# Patient Record
Sex: Male | Born: 1975 | Race: White | Hispanic: No | Marital: Married | Smoking: Never smoker
Health system: Southern US, Community
[De-identification: ages and names within clinical notes are randomized; demographics above are authoritative.]

## PROBLEM LIST (undated history)

## (undated) DIAGNOSIS — E669 Obesity, unspecified: Secondary | ICD-10-CM

## (undated) DIAGNOSIS — I10 Essential (primary) hypertension: Secondary | ICD-10-CM

## (undated) HISTORY — PX: CHOLECYSTECTOMY: SHX55

## (undated) HISTORY — PX: SHOULDER SURGERY: SHX246

---

## 2004-06-26 ENCOUNTER — Emergency Department (HOSPITAL_COMMUNITY): Admission: EM | Admit: 2004-06-26 | Discharge: 2004-06-26 | Payer: Self-pay | Admitting: Family Medicine

## 2004-06-29 ENCOUNTER — Emergency Department (HOSPITAL_COMMUNITY): Admission: EM | Admit: 2004-06-29 | Discharge: 2004-06-29 | Payer: Self-pay | Admitting: Family Medicine

## 2006-01-25 ENCOUNTER — Emergency Department (HOSPITAL_COMMUNITY): Admission: EM | Admit: 2006-01-25 | Discharge: 2006-01-25 | Payer: Self-pay | Admitting: Family Medicine

## 2007-07-25 ENCOUNTER — Emergency Department (HOSPITAL_COMMUNITY): Admission: EM | Admit: 2007-07-25 | Discharge: 2007-07-25 | Payer: Self-pay | Admitting: Family Medicine

## 2008-06-30 ENCOUNTER — Encounter: Admission: RE | Admit: 2008-06-30 | Discharge: 2008-06-30 | Payer: Self-pay | Admitting: Family Medicine

## 2009-03-23 ENCOUNTER — Encounter: Payer: Self-pay | Admitting: Family Medicine

## 2009-04-13 ENCOUNTER — Ambulatory Visit: Payer: Self-pay | Admitting: Family Medicine

## 2009-04-13 DIAGNOSIS — M19019 Primary osteoarthritis, unspecified shoulder: Secondary | ICD-10-CM | POA: Insufficient documentation

## 2009-04-13 DIAGNOSIS — M5030 Other cervical disc degeneration, unspecified cervical region: Secondary | ICD-10-CM | POA: Insufficient documentation

## 2009-04-18 ENCOUNTER — Telehealth: Payer: Self-pay | Admitting: Family Medicine

## 2009-04-25 ENCOUNTER — Encounter: Payer: Self-pay | Admitting: Family Medicine

## 2009-05-11 ENCOUNTER — Telehealth: Payer: Self-pay | Admitting: Family Medicine

## 2009-05-22 ENCOUNTER — Encounter: Payer: Self-pay | Admitting: Family Medicine

## 2009-05-22 ENCOUNTER — Telehealth: Payer: Self-pay | Admitting: Family Medicine

## 2009-06-14 ENCOUNTER — Encounter: Admission: RE | Admit: 2009-06-14 | Discharge: 2009-06-14 | Payer: Self-pay | Admitting: Neurosurgery

## 2009-06-21 ENCOUNTER — Encounter: Payer: Self-pay | Admitting: Family Medicine

## 2009-06-26 ENCOUNTER — Encounter: Admission: RE | Admit: 2009-06-26 | Discharge: 2009-06-26 | Payer: Self-pay | Admitting: Neurosurgery

## 2009-06-27 ENCOUNTER — Telehealth: Payer: Self-pay | Admitting: Family Medicine

## 2009-07-03 ENCOUNTER — Encounter: Payer: Self-pay | Admitting: Family Medicine

## 2009-07-20 ENCOUNTER — Encounter
Admission: RE | Admit: 2009-07-20 | Discharge: 2009-07-20 | Payer: Self-pay | Admitting: Physical Medicine & Rehabilitation

## 2009-12-07 ENCOUNTER — Encounter
Admission: RE | Admit: 2009-12-07 | Discharge: 2009-12-07 | Payer: Self-pay | Admitting: Physical Medicine & Rehabilitation

## 2010-07-22 ENCOUNTER — Encounter: Payer: Self-pay | Admitting: Family Medicine

## 2010-07-31 NOTE — Consult Note (Signed)
Summary: [facility]  [facility]   Imported By: Maryln Gottron 07/07/2009 15:03:16  _____________________________________________________________________  External Attachment:    Type:   Image     Comment:   External Document

## 2010-07-31 NOTE — Letter (Signed)
Summary: [facility]  [facility]   Imported By: Maryln Gottron 07/20/2009 11:17:27  _____________________________________________________________________  External Attachment:    Type:   Image     Comment:   External Document

## 2010-07-31 NOTE — Letter (Signed)
Summary: [facility]  [facility]   Imported By: Maryln Gottron 07/20/2009 11:18:47  _____________________________________________________________________  External Attachment:    Type:   Image     Comment:   External Document

## 2010-07-31 NOTE — Consult Note (Signed)
Summary: [facility]  [facility]   Imported By: Maryln Gottron 09/06/2009 13:35:32  _____________________________________________________________________  External Attachment:    Type:   Image     Comment:   External Document

## 2010-11-05 ENCOUNTER — Ambulatory Visit (INDEPENDENT_AMBULATORY_CARE_PROVIDER_SITE_OTHER): Payer: BC Managed Care – PPO

## 2010-11-05 ENCOUNTER — Inpatient Hospital Stay (INDEPENDENT_AMBULATORY_CARE_PROVIDER_SITE_OTHER)
Admission: RE | Admit: 2010-11-05 | Discharge: 2010-11-05 | Disposition: A | Discharge status: Home / Self Care | Payer: BC Managed Care – PPO | Source: Ambulatory Visit | Attending: Family Medicine | Admitting: Family Medicine

## 2010-11-05 DIAGNOSIS — S6390XA Sprain of unspecified part of unspecified wrist and hand, initial encounter: Secondary | ICD-10-CM

## 2011-03-22 ENCOUNTER — Emergency Department (HOSPITAL_COMMUNITY): Payer: No Typology Code available for payment source

## 2011-03-22 ENCOUNTER — Emergency Department (HOSPITAL_COMMUNITY)
Admission: EM | Admit: 2011-03-22 | Discharge: 2011-03-23 | Disposition: A | Discharge status: Home / Self Care | Payer: No Typology Code available for payment source | Attending: Emergency Medicine | Admitting: Emergency Medicine

## 2011-03-22 DIAGNOSIS — R071 Chest pain on breathing: Secondary | ICD-10-CM | POA: Insufficient documentation

## 2011-03-22 DIAGNOSIS — R0602 Shortness of breath: Secondary | ICD-10-CM | POA: Insufficient documentation

## 2011-03-22 LAB — DIFFERENTIAL
Basophils Absolute: 0.1 10*3/uL (ref 0.0–0.1)
Basophils Relative: 1 % (ref 0–1)
Eosinophils Absolute: 0.1 10*3/uL (ref 0.0–0.7)
Eosinophils Relative: 2 % (ref 0–5)
Lymphocytes Relative: 37 % (ref 12–46)
Monocytes Absolute: 0.7 10*3/uL (ref 0.1–1.0)
Monocytes Relative: 9 % (ref 3–12)
Neutrophils Relative %: 52 % (ref 43–77)

## 2011-03-22 LAB — COMPREHENSIVE METABOLIC PANEL
AST: 38 U/L — ABNORMAL HIGH (ref 0–37)
Albumin: 4.1 g/dL (ref 3.5–5.2)
Alkaline Phosphatase: 66 U/L (ref 39–117)
BUN: 13 mg/dL (ref 6–23)
CO2: 28 mEq/L (ref 19–32)
Chloride: 107 mEq/L (ref 96–112)
Creatinine, Ser: 1.22 mg/dL (ref 0.50–1.35)
Potassium: 3.7 mEq/L (ref 3.5–5.1)
Sodium: 144 mEq/L (ref 135–145)
Total Bilirubin: 1.3 mg/dL — ABNORMAL HIGH (ref 0.3–1.2)
Total Protein: 6.8 g/dL (ref 6.0–8.3)

## 2011-03-22 LAB — URINE MICROSCOPIC-ADD ON

## 2011-03-22 LAB — URINALYSIS, ROUTINE W REFLEX MICROSCOPIC
Bilirubin Urine: NEGATIVE
Glucose, UA: NEGATIVE mg/dL
Ketones, ur: NEGATIVE mg/dL
Leukocytes, UA: NEGATIVE
Nitrite: NEGATIVE
Protein, ur: NEGATIVE mg/dL
Specific Gravity, Urine: 1.02 (ref 1.005–1.030)
Urobilinogen, UA: 0.2 mg/dL (ref 0.0–1.0)

## 2011-03-22 LAB — CBC
MCH: 32.9 pg (ref 26.0–34.0)
MCHC: 37.5 g/dL — ABNORMAL HIGH (ref 30.0–36.0)
Platelets: 217 10*3/uL (ref 150–400)
RBC: 4.32 MIL/uL (ref 4.22–5.81)
WBC: 7.5 10*3/uL (ref 4.0–10.5)

## 2011-03-22 LAB — POCT I-STAT TROPONIN I: Troponin i, poc: 0.04 ng/mL (ref 0.00–0.08)

## 2011-03-22 LAB — D-DIMER, QUANTITATIVE: D-Dimer, Quant: <0.22 ug/mL-FEU (ref 0.00–0.48)

## 2011-03-23 LAB — POCT I-STAT TROPONIN I: Troponin i, poc: 0 ng/mL (ref 0.00–0.08)

## 2014-12-02 ENCOUNTER — Emergency Department (INDEPENDENT_AMBULATORY_CARE_PROVIDER_SITE_OTHER)
Admission: EM | Admit: 2014-12-02 | Discharge: 2014-12-02 | Disposition: A | Discharge status: Home / Self Care | Payer: No Typology Code available for payment source | Source: Home / Self Care | Attending: Family Medicine | Admitting: Family Medicine

## 2014-12-02 ENCOUNTER — Encounter (HOSPITAL_COMMUNITY): Payer: Self-pay | Admitting: Emergency Medicine

## 2014-12-02 DIAGNOSIS — S39012A Strain of muscle, fascia and tendon of lower back, initial encounter: Secondary | ICD-10-CM | POA: Diagnosis not present

## 2014-12-02 MED ORDER — METAXALONE 800 MG PO TABS: 800.0000 mg | ORAL_TABLET | Freq: Three times a day (TID) | ORAL | Status: DC

## 2014-12-02 NOTE — Discharge Instructions (Signed)
Heat, stretch and medicine as prescribed, return as needed.

## 2014-12-02 NOTE — ED Notes (Signed)
Pt was lifting something yesterday when he felt a pull in his right mid back.  He now has sharp pain when taking deep breaths and has some limited ROM in the back.

## 2014-12-02 NOTE — ED Provider Notes (Addendum)
CSN: [id number]     Arrival date & time 12/02/14  1620 History   First MD Initiated Contact with Patient 12/02/14 1748     Chief Complaint  Patient presents with  . Back Pain   (Consider location/radiation/quality/duration/timing/severity/associated sxs/prior Treatment) Patient is a 39 y.o. male presenting with back pain. The history is provided by the patient.  Back Pain Location:  Lumbar spine Quality:  Stabbing and shooting Radiates to:  Does not radiate Pain severity:  Mild Onset quality:  Sudden Duration:  1 day Chronicity:  New Context: lifting heavy objects   Context comment:  Lifting a heavy trash bag for parents and felt pain in right mid back which continues with certain miovements and positions   History reviewed. No pertinent past medical history. Past Surgical History  Procedure Laterality Date  . Cholecystectomy    . Shoulder surgery     History reviewed. No pertinent family history. History  Substance Use Topics  . Smoking status: Never Smoker   . Smokeless tobacco: Never Used  . Alcohol Use: No    Review of Systems  Constitutional: Negative.   Gastrointestinal: Negative.   Genitourinary: Negative.   Musculoskeletal: Positive for back pain. Negative for joint swelling and gait problem.  Skin: Negative.     Allergies  Niacin  Home Medications   Prior to Admission medications   Medication Sig Start Date End Date Taking? Authorizing Provider  metaxalone (SKELAXIN) 800 MG tablet Take 1 tablet (800 mg total) by mouth 3 (three) times daily. 12/02/14   Linna HoffJames D Kindl, MD   BP 121/80 mmHg  Pulse 88  Temp(Src) 97.9 F (36.6 C) (Oral)  Resp 16  SpO2 100% Physical Exam  Constitutional: He is oriented to person, place, and time. He appears well-developed and well-nourished.  Cardiovascular: Normal heart sounds.   Pulmonary/Chest: Effort normal and breath sounds normal. No respiratory distress.  Abdominal: Soft. Bowel sounds are normal.  Musculoskeletal:  He exhibits tenderness.       Back:  Neurological: He is alert and oriented to person, place, and time.  Skin: Skin is warm and dry.  Nursing note and vitals reviewed.   ED Course  Procedures (including critical care time) Labs Review Labs Reviewed - No data to display  Imaging Review No results found.   MDM   1. Low back strain, initial encounter        Linna HoffJames D Kindl, MD 12/02/14 1809  Linna HoffJames D Kindl, MD 12/02/14 2154

## 2018-02-11 ENCOUNTER — Other Ambulatory Visit: Payer: Self-pay | Admitting: Orthopaedic Surgery

## 2018-02-11 DIAGNOSIS — M546 Pain in thoracic spine: Secondary | ICD-10-CM

## 2018-02-16 ENCOUNTER — Ambulatory Visit
Admission: RE | Admit: 2018-02-16 | Discharge: 2018-02-16 | Disposition: A | Discharge status: Home / Self Care | Payer: BC Managed Care – PPO | Source: Ambulatory Visit | Attending: Orthopaedic Surgery | Admitting: Orthopaedic Surgery

## 2018-02-16 DIAGNOSIS — M546 Pain in thoracic spine: Secondary | ICD-10-CM

## 2019-05-03 IMAGING — MR MR THORACIC SPINE W/O CM
4 of 6 series · 14 of 48 positions shown · non-contrast
Comparison: MRIs dated 08/04/2014 and 06/26/2009

CLINICAL DATA: Chronic recurrent thoracic back pain.

EXAM:
MRI THORACIC SPINE WITHOUT CONTRAST
TECHNIQUE: Multiplanar, multisequence MR imaging of the thoracic spine was
performed. No intravenous contrast was administered.

[Series 4: T2 · sagittal · 4.0mm · 0.43mm/px · 5 of 17 slices shown (1 of 3)]
[im 1/17]
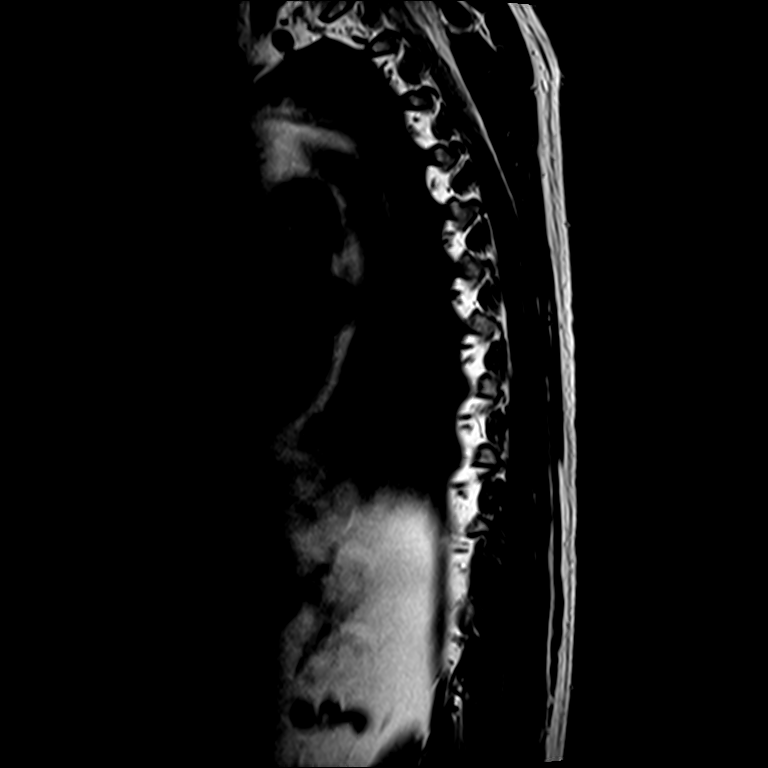
[im 4/17]
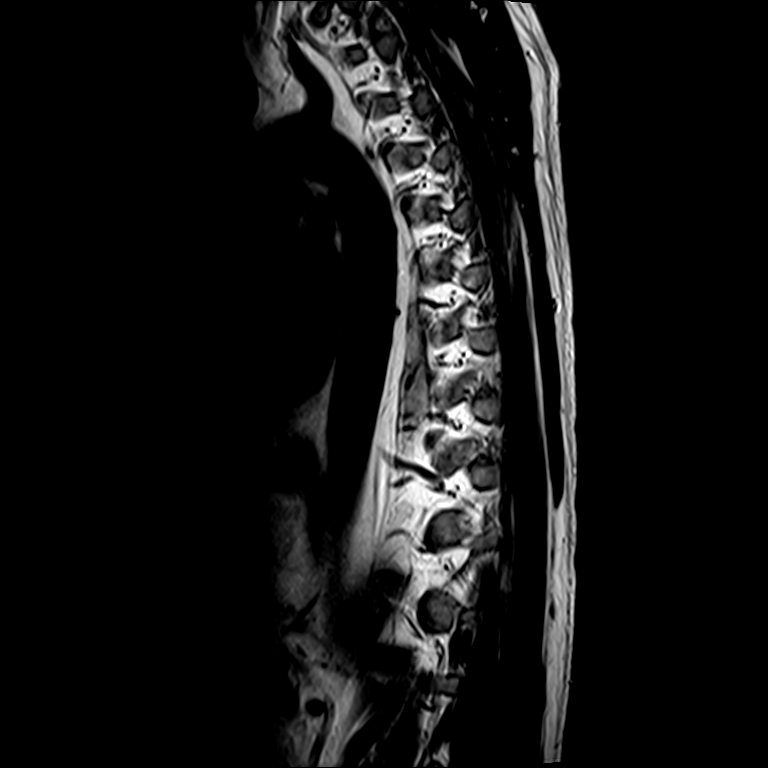
[im 7/17]
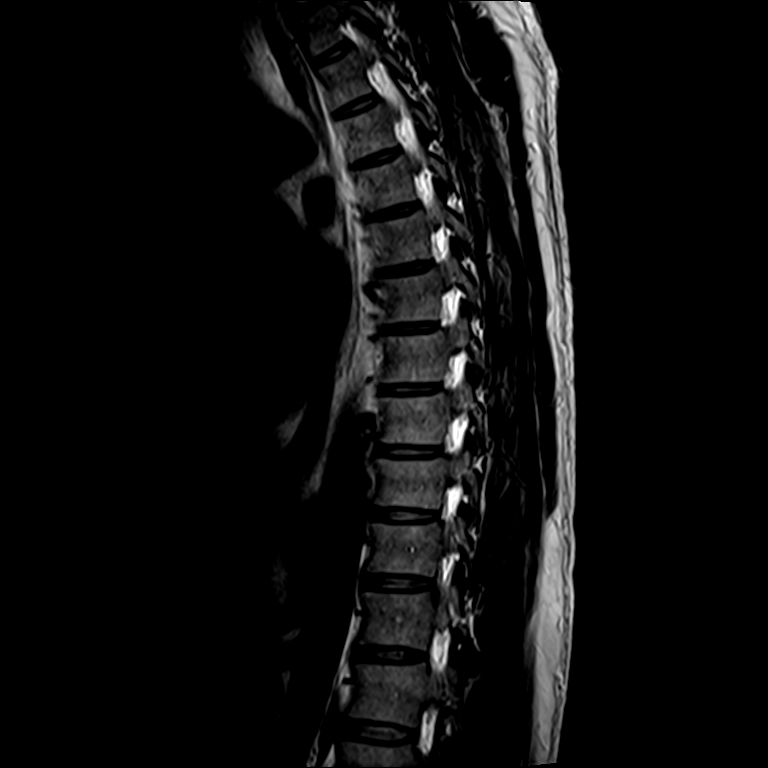
[im 10/17]
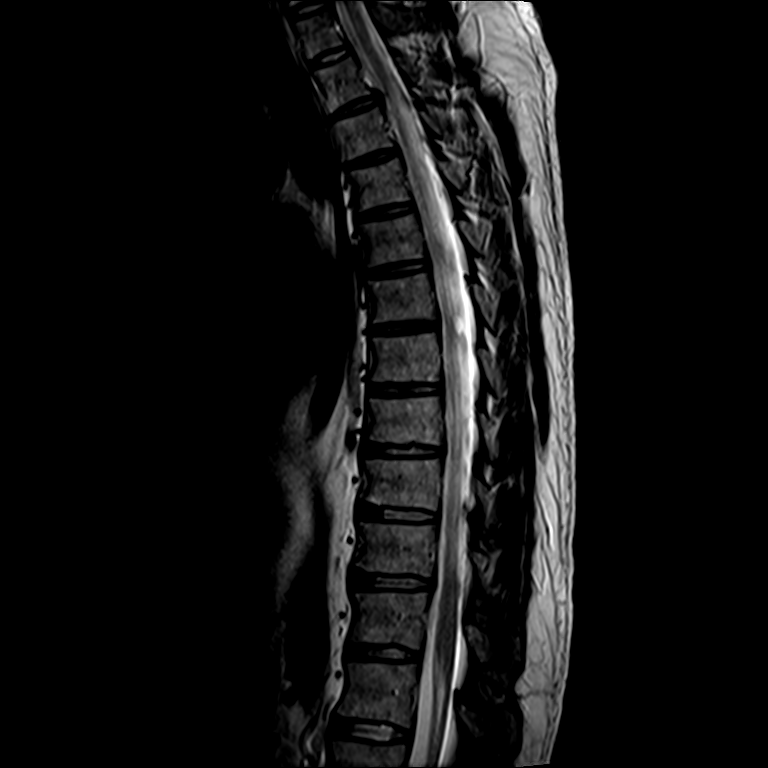
[im 17/17]
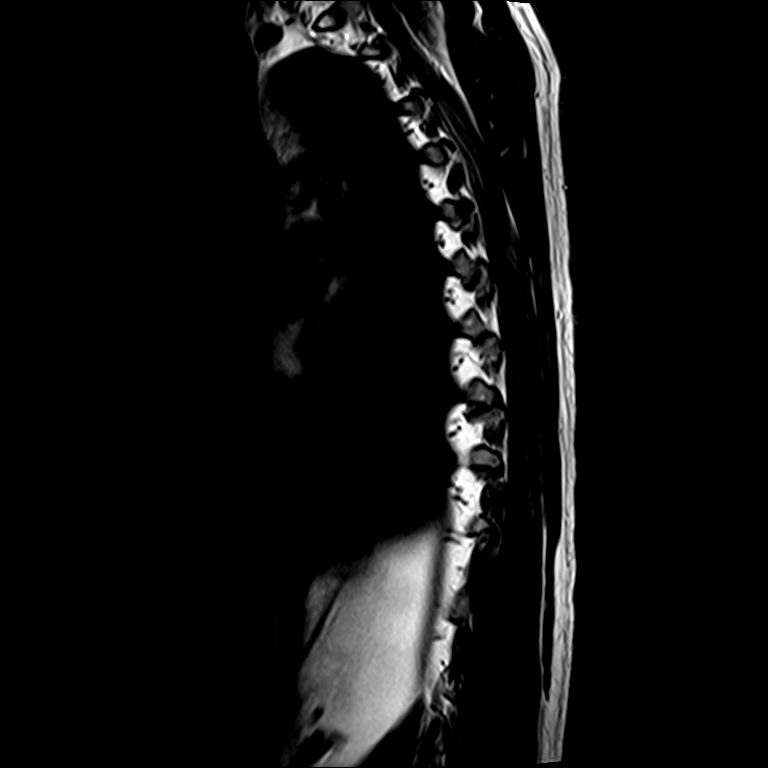

[Series 6: T1 · sagittal · 4.0mm · 0.86mm/px · 3 of 17 slices shown]
[im 4/17]
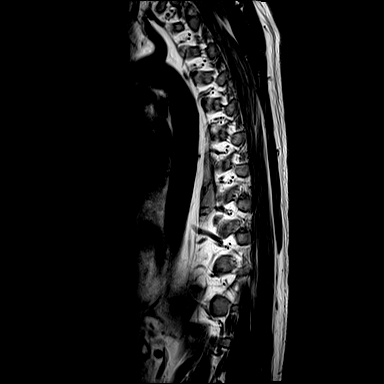
[im 10/17]
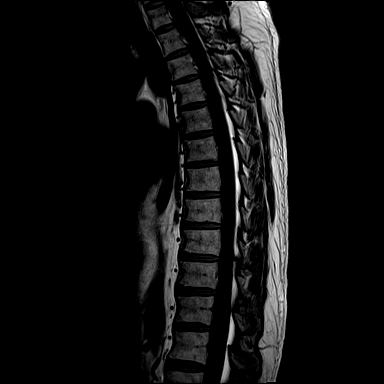
[im 17/17]
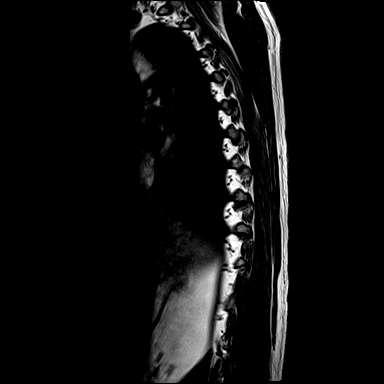

[Series 7: T2 · axial · 4.0mm · 0.39mm/px · z∈[-301,-120]mm · 3 of 36 slices shown (2 of 3)]
[im 6/36]
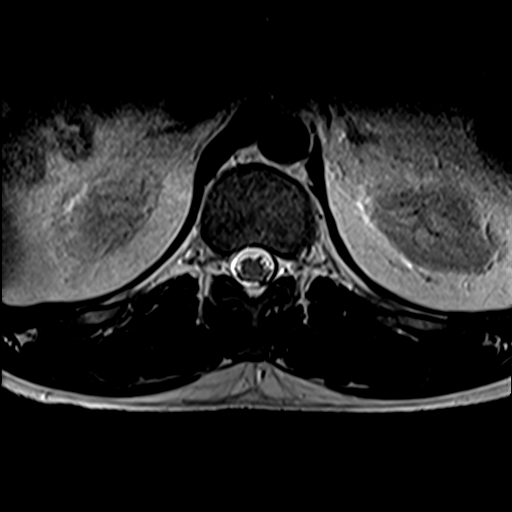
[im 18/36]
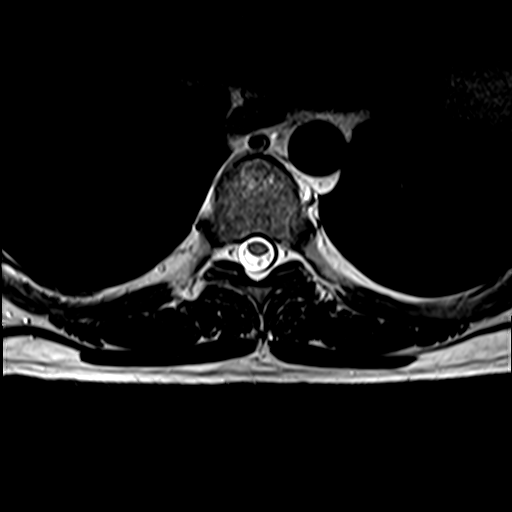
[im 30/36]
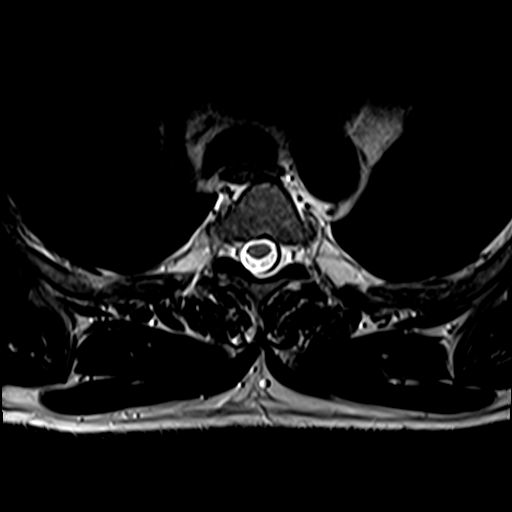

[Series 8: T2 · axial · 4.0mm · 0.39mm/px · z∈[-301,-120]mm · 3 of 36 slices shown (3 of 3)]
[im 6/36]
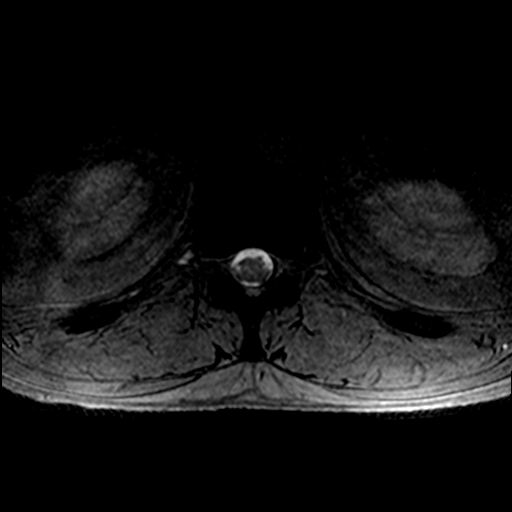
[im 18/36]
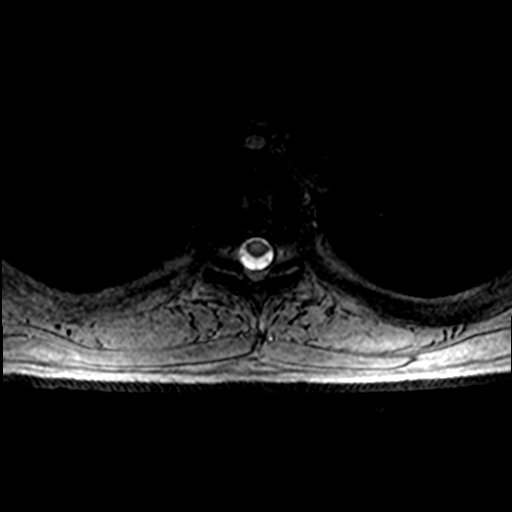
[im 30/36]
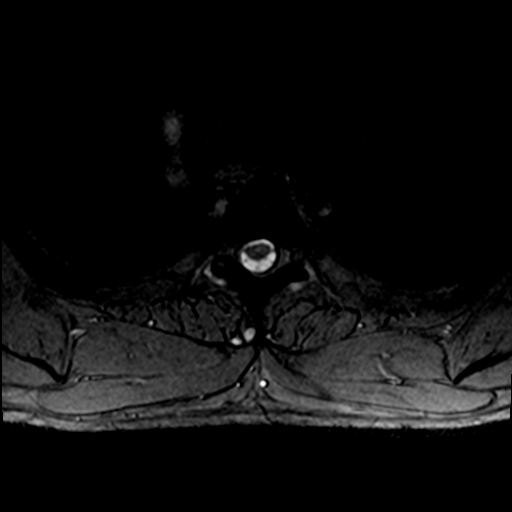

[14 of 48 positions shown; findings below may reference images not displayed]

FINDINGS: Alignment:  Physiologic.

Vertebrae: No fracture, evidence of discitis, or bone lesion.

Cord:  Normal signal and morphology.  Tip of the conus is at T12.

Paraspinal and other soft tissues: Negative.

Disc levels:

T1-2: Normal.

T2-3: Chronic small soft disc protrusion just to the right of
midline extending into the inferior aspect of the right neural
foramen without neural impingement. The appearance is essentially
unchanged. Slight mass effect upon the ventral aspect of the right
side of the spinal cord without myelopathy.

T3-4 through T12-L1: No significant abnormality of the discs. Tiny
disc protrusions seen at T6-7 and T8-9 on the prior exam are not
visible on this exam.

No spinal or foraminal stenosis.  No appreciable facet arthritis.
IMPRESSION: 1. Chronic small disc protrusion to the right of midline at T2-3
with a slight impression upon the ventral aspect of the right side
of the spinal cord, unchanged.
2. No other significant abnormalities.

## 2019-07-31 ENCOUNTER — Encounter (HOSPITAL_COMMUNITY): Payer: Self-pay | Admitting: Emergency Medicine

## 2019-07-31 ENCOUNTER — Other Ambulatory Visit: Payer: Self-pay

## 2019-07-31 ENCOUNTER — Emergency Department (HOSPITAL_COMMUNITY): Payer: BC Managed Care – PPO

## 2019-07-31 ENCOUNTER — Emergency Department (HOSPITAL_COMMUNITY)
Admission: EM | Admit: 2019-07-31 | Discharge: 2019-07-31 | Disposition: A | Discharge status: Home / Self Care | Payer: BC Managed Care – PPO | Attending: Emergency Medicine | Admitting: Emergency Medicine

## 2019-07-31 ENCOUNTER — Telehealth: Payer: Self-pay | Admitting: Physician Assistant

## 2019-07-31 DIAGNOSIS — M545 Low back pain: Secondary | ICD-10-CM | POA: Insufficient documentation

## 2019-07-31 DIAGNOSIS — R42 Dizziness and giddiness: Secondary | ICD-10-CM | POA: Insufficient documentation

## 2019-07-31 DIAGNOSIS — J1282 Pneumonia due to coronavirus disease 2019: Secondary | ICD-10-CM | POA: Insufficient documentation

## 2019-07-31 DIAGNOSIS — R05 Cough: Secondary | ICD-10-CM | POA: Diagnosis present

## 2019-07-31 DIAGNOSIS — U071 COVID-19: Secondary | ICD-10-CM | POA: Diagnosis not present

## 2019-07-31 DIAGNOSIS — R0600 Dyspnea, unspecified: Secondary | ICD-10-CM | POA: Insufficient documentation

## 2019-07-31 HISTORY — DX: Obesity, unspecified: E66.9

## 2019-07-31 HISTORY — DX: Essential (primary) hypertension: I10

## 2019-07-31 LAB — POC SARS CORONAVIRUS 2 AG -  ED: SARS Coronavirus 2 Ag: POSITIVE — AB

## 2019-07-31 MED ORDER — HYDROCOD POLST-CPM POLST ER 10-8 MG/5ML PO SUER
5.0000 mL | Freq: Once | ORAL | Status: AC
  Administered 2019-07-31: 5 mL via ORAL
  Filled 2019-07-31: qty 5

## 2019-07-31 MED ORDER — HYDROCOD POLST-CPM POLST ER 10-8 MG/5ML PO SUER: 5.0000 mL | Freq: Two times a day (BID) | ORAL | 0 refills | Status: AC | PRN

## 2019-07-31 NOTE — ED Provider Notes (Signed)
[facility] EMERGENCY DEPARTMENT Provider Note   CSN: [id number] Arrival date & time: 07/31/19  1020     History Chief Complaint  Patient presents with  . Shortness of Breath  . Cough  . Fever    Johnny Knight is a 44 y.o. male.  HPI 44 year old male presents today complaining of cough.  He states that he was exposed to Covid 13 days ago.  He began having symptoms on Wednesday.  He states he was at a church event that 24 people got sick after.  His wife and child have been sick prior to him beginning having symptoms.  He states he took his daughter to school in [place] on Wednesday.  When he returned from the drive he felt weak lightheaded and started coughing.  He has continued to cough.  He had some dyspnea last night and checked his oxygen saturations and they were 88%.  He states they were 92% on arrival here.  He denies denies cough being productive.  He has been taking ibuprofen for some back pain.  Pain is in the low back area.  He denies any definite fever.  He has had some chills and diffuse muscle aches.    History reviewed. No pertinent past medical history.  Patient Active Problem List   Diagnosis Date Noted  . ARTHRITIS, SHOULDER 04/13/2009  . 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 CHOLECYSTECTOMY    . SHOULDER SURGERY         No family history on file.  Social History   Tobacco Use  . Smoking status: Never Smoker  . Smokeless tobacco: Never Used  Substance Use Topics  . Alcohol use: No  . Drug use: No    Home Medications Prior to Admission medications   Medication Sig Start Date End Date Taking? Authorizing Provider  metaxalone (SKELAXIN) 800 MG tablet Take 1 tablet (800 mg total) by mouth 3 (three) times daily. 12/02/14   Billy Fischer, MD    Allergies    Niacin  Review of Systems   Review of Systems  All other systems reviewed and are  negative.   Physical Exam Updated Vital Signs BP 112/76 (BP Location: Left Arm)   Pulse 98   Temp 99.5 F (37.5 C) (Oral)   Resp (!) 22   Ht 1.778 m (5\' 10" )   Wt 95.3 kg   SpO2 92%   BMI 30.13 kg/m   Physical Exam Vitals and nursing note reviewed.  Constitutional:      General: He is not in acute distress.    Appearance: He is well-developed. He is ill-appearing.  HENT:     Head: Normocephalic.     Mouth/Throat:     Mouth: Mucous membranes are moist.  Eyes:     Pupils: Pupils are equal, round, and reactive to light.  Cardiovascular:     Rate and Rhythm: Normal rate and regular rhythm.  Pulmonary:     Effort: Pulmonary effort is normal.     Breath sounds: Normal breath sounds.     Comments: Coughing on exam Abdominal:     General: Bowel sounds are normal.     Palpations: Abdomen is soft.  Musculoskeletal:        General: Normal range of motion.     Cervical back: Normal range of motion.     Right lower leg: No tenderness. No edema.     Left lower leg:  No tenderness. No edema.  Skin:    General: Skin is warm and dry.     Capillary Refill: Capillary refill takes less than 2 seconds.  Neurological:     General: No focal deficit present.     Mental Status: He is alert.  Psychiatric:        Mood and Affect: Mood normal.     ED Results / Procedures / Treatments   Labs (all labs ordered are listed, but only abnormal results are displayed) Labs Reviewed  POC SARS CORONAVIRUS 2 AG -  ED    EKG None  Radiology No results found.  Procedures Procedures (including critical care time)  Medications Ordered in ED Medications - No data to display  ED Course  I have reviewed the triage vital signs and the nursing notes.  Pertinent labs & imaging results that were available during my care of the patient were reviewed by me and considered in my medical decision making (see chart for details). Johnny Knight was evaluated in Emergency Department on 07/31/2019  for the symptoms described in the history of present illness. He was evaluated in the context of the global COVID-19 pandemic, which necessitated consideration that the patient might be at risk for infection with the SARS-CoV-2 virus that causes COVID-19. Institutional protocols and algorithms that pertain to the evaluation of patients at risk for COVID-19 are in a state of rapid change based on information released by regulatory bodies including the CDC and federal and state organizations. These policies and algorithms were followed during the patient's care in the ED.  44 year old male history of hypertension day 3 of illness Covid positive presents complaining of ongoing cough with some low sats at home.  Oxygen saturation has been 92 to 99% here in the ED.  He is not tachypneic.  Plan Tussionex.  He has been referred to the [facility].  Patient is advised of return precautions and need for follow-up and voices understanding.    MDM Rules/Calculators/A&P                     Final Clinical Impression(s) / ED Diagnoses Final diagnoses:  Pneumonia due to COVID-19 virus    Rx / DC Orders ED Discharge Orders    None       Margarita Grizzle, MD 07/31/19 1147

## 2019-07-31 NOTE — Discharge Instructions (Addendum)
You are covid positive and appear to have covid pneumonia.  Please use cough medicine as needed to control your cough.  Here you oxygen has been normal to slightly low.  If it is worsening and staying below 90%, please return to the ED.  You have been referred for monoclonal antibody therapy, please answer your phone for follow up Monday.

## 2019-07-31 NOTE — ED Notes (Signed)
Pt discharge instructions and follow-up care reviewed with the patient. Pt verbalized understanding of instructions. Pt discharged.

## 2019-07-31 NOTE — ED Triage Notes (Signed)
Pt. Stated, My daughter and wife tested positive for COVID and my Dr. Lanna Poche, never mind testing you have it. I started having cough, SOB not moving anything in my chest. Ive had sweats, fever since Wednesday. My SOB seems worse.

## 2019-07-31 NOTE — Telephone Encounter (Signed)
Called to discuss with patient about Covid symptoms and the use of bamlanivimab or casirivimab/imdevimab, a monoclonal antibody infusion for those with mild to moderate Covid symptoms and at a high risk of hospitalization. Upon initial chart review, the pt would not qualify given age <55 with HTN.    Message left to call back to see if there are any other undocumented RFs that might qualify him.   Cline Crock PA-C  [facility]

## 2019-08-02 ENCOUNTER — Emergency Department (HOSPITAL_COMMUNITY): Payer: BC Managed Care – PPO

## 2019-08-02 ENCOUNTER — Encounter (HOSPITAL_COMMUNITY): Payer: Self-pay | Admitting: *Deleted

## 2019-08-02 ENCOUNTER — Other Ambulatory Visit: Payer: Self-pay

## 2019-08-02 ENCOUNTER — Inpatient Hospital Stay (HOSPITAL_COMMUNITY)
Admission: EM | Admit: 2019-08-02 | Discharge: 2019-08-04 | DRG: 177 | Disposition: A | Discharge status: Home / Self Care | Payer: BC Managed Care – PPO | Attending: Internal Medicine | Admitting: Internal Medicine

## 2019-08-02 DIAGNOSIS — R0902 Hypoxemia: Secondary | ICD-10-CM | POA: Diagnosis present

## 2019-08-02 DIAGNOSIS — E860 Dehydration: Secondary | ICD-10-CM | POA: Diagnosis present

## 2019-08-02 DIAGNOSIS — J9601 Acute respiratory failure with hypoxia: Secondary | ICD-10-CM | POA: Diagnosis not present

## 2019-08-02 DIAGNOSIS — J1282 Pneumonia due to coronavirus disease 2019: Secondary | ICD-10-CM | POA: Diagnosis present

## 2019-08-02 DIAGNOSIS — N179 Acute kidney failure, unspecified: Secondary | ICD-10-CM

## 2019-08-02 DIAGNOSIS — I10 Essential (primary) hypertension: Secondary | ICD-10-CM

## 2019-08-02 DIAGNOSIS — U071 COVID-19: Secondary | ICD-10-CM | POA: Diagnosis not present

## 2019-08-02 DIAGNOSIS — Z79899 Other long term (current) drug therapy: Secondary | ICD-10-CM | POA: Diagnosis not present

## 2019-08-02 LAB — CBC WITH DIFFERENTIAL/PLATELET
Abs Immature Granulocytes: 0.03 10*3/uL (ref 0.00–0.07)
Basophils Absolute: 0 10*3/uL (ref 0.0–0.1)
Basophils Relative: 0 %
Eosinophils Absolute: 0.1 10*3/uL (ref 0.0–0.5)
Eosinophils Relative: 1 %
HCT: 37.9 % — ABNORMAL LOW (ref 39.0–52.0)
Hemoglobin: 13.6 g/dL (ref 13.0–17.0)
Immature Granulocytes: 1 %
Lymphocytes Relative: 19 %
Lymphs Abs: 1 10*3/uL (ref 0.7–4.0)
MCH: 33.6 pg (ref 26.0–34.0)
MCHC: 35.9 g/dL (ref 30.0–36.0)
MCV: 93.6 fL (ref 80.0–100.0)
Monocytes Absolute: 0.4 10*3/uL (ref 0.1–1.0)
Monocytes Relative: 7 %
Neutro Abs: 3.7 10*3/uL (ref 1.7–7.7)
Neutrophils Relative %: 72 %
Platelets: 229 10*3/uL (ref 150–400)
RBC: 4.05 MIL/uL — ABNORMAL LOW (ref 4.22–5.81)
RDW: 11.2 % — ABNORMAL LOW (ref 11.5–15.5)
WBC: 5.2 10*3/uL (ref 4.0–10.5)
nRBC: 0 % (ref 0.0–0.2)

## 2019-08-02 LAB — LACTATE DEHYDROGENASE: LDH: 366 U/L — ABNORMAL HIGH (ref 98–192)

## 2019-08-02 LAB — COMPREHENSIVE METABOLIC PANEL
ALT: 72 U/L — ABNORMAL HIGH (ref 0–44)
AST: 82 U/L — ABNORMAL HIGH (ref 15–41)
Albumin: 3.1 g/dL — ABNORMAL LOW (ref 3.5–5.0)
Alkaline Phosphatase: 100 U/L (ref 38–126)
Anion gap: 16 — ABNORMAL HIGH (ref 5–15)
BUN: 12 mg/dL (ref 6–20)
CO2: 20 mmol/L — ABNORMAL LOW (ref 22–32)
Calcium: 8.3 mg/dL — ABNORMAL LOW (ref 8.9–10.3)
Chloride: 105 mmol/L (ref 98–111)
Creatinine, Ser: 1.49 mg/dL — ABNORMAL HIGH (ref 0.61–1.24)
GFR calc Af Amer: >60 mL/min (ref >60)
GFR calc non Af Amer: 57 mL/min — ABNORMAL LOW (ref >60)
Glucose, Bld: 97 mg/dL (ref 70–99)
Potassium: 3.4 mmol/L — ABNORMAL LOW (ref 3.5–5.1)
Sodium: 141 mmol/L (ref 135–145)
Total Bilirubin: 1.8 mg/dL — ABNORMAL HIGH (ref 0.3–1.2)
Total Protein: 6.4 g/dL — ABNORMAL LOW (ref 6.5–8.1)

## 2019-08-02 LAB — TRIGLYCERIDES: Triglycerides: 123 mg/dL (ref <150)

## 2019-08-02 LAB — FERRITIN: Ferritin: 486 ng/mL — ABNORMAL HIGH (ref 24–336)

## 2019-08-02 LAB — PROCALCITONIN: Procalcitonin: <0.1 ng/mL

## 2019-08-02 LAB — HIV ANTIBODY (ROUTINE TESTING W REFLEX): HIV Screen 4th Generation wRfx: NONREACTIVE

## 2019-08-02 LAB — LACTIC ACID, PLASMA
Lactic Acid, Venous: 1.7 mmol/L (ref 0.5–1.9)
Lactic Acid, Venous: 0.9 mmol/L (ref 0.5–1.9)

## 2019-08-02 LAB — D-DIMER, QUANTITATIVE: D-Dimer, Quant: 1.34 ug/mL-FEU — ABNORMAL HIGH (ref 0.00–0.50)

## 2019-08-02 LAB — C-REACTIVE PROTEIN: CRP: 11.1 mg/dL — ABNORMAL HIGH (ref <1.0)

## 2019-08-02 LAB — CK: Total CK: 90 U/L (ref 49–397)

## 2019-08-02 LAB — FIBRINOGEN: Fibrinogen: 732 mg/dL — ABNORMAL HIGH (ref 210–475)

## 2019-08-02 MED ORDER — HYDROCOD POLST-CPM POLST ER 10-8 MG/5ML PO SUER: 5.0000 mL | Freq: Two times a day (BID) | ORAL | Status: DC

## 2019-08-02 MED ORDER — INFLUENZA VAC SPLIT QUAD 0.5 ML IM SUSY
0.5000 mL | PREFILLED_SYRINGE | INTRAMUSCULAR | Status: DC | PRN
  Filled 2019-08-02: qty 0.5

## 2019-08-02 MED ORDER — ALBUTEROL SULFATE HFA 108 (90 BASE) MCG/ACT IN AERS
2.0000 | INHALATION_SPRAY | Freq: Four times a day (QID) | RESPIRATORY_TRACT | Status: DC
  Administered 2019-08-02: 16:00:00 2 via RESPIRATORY_TRACT
  Filled 2019-08-02: qty 6.7

## 2019-08-02 MED ORDER — POTASSIUM CHLORIDE CRYS ER 20 MEQ PO TBCR: 20.0000 meq | EXTENDED_RELEASE_TABLET | Freq: Once | ORAL | Status: DC

## 2019-08-02 MED ORDER — IPRATROPIUM-ALBUTEROL 20-100 MCG/ACT IN AERS
1.0000 | INHALATION_SPRAY | Freq: Four times a day (QID) | RESPIRATORY_TRACT | Status: DC | PRN
  Filled 2019-08-02: qty 4

## 2019-08-02 MED ORDER — GUAIFENESIN-DM 100-10 MG/5ML PO SYRP
10.0000 mL | ORAL_SOLUTION | ORAL | Status: DC | PRN
  Administered 2019-08-02 – 2019-08-04 (×5): 10 mL via ORAL
  Filled 2019-08-02 (×5): qty 10

## 2019-08-02 MED ORDER — ATENOLOL 25 MG PO TABS: 25.0000 mg | ORAL_TABLET | Freq: Every day | ORAL | Status: DC

## 2019-08-02 MED ORDER — BENZONATATE 100 MG PO CAPS
200.0000 mg | ORAL_CAPSULE | Freq: Three times a day (TID) | ORAL | Status: DC
  Filled 2019-08-02: qty 2

## 2019-08-02 MED ORDER — SODIUM CHLORIDE 0.9 % IV SOLN
200.0000 mg | Freq: Once | INTRAVENOUS | Status: AC
  Administered 2019-08-02: 12:00:00 200 mg via INTRAVENOUS
  Filled 2019-08-02: qty 200

## 2019-08-02 MED ORDER — HYDROCOD POLST-CPM POLST ER 10-8 MG/5ML PO SUER
5.0000 mL | Freq: Two times a day (BID) | ORAL | Status: DC
  Administered 2019-08-02: 5 mL via ORAL
  Filled 2019-08-02: qty 5

## 2019-08-02 MED ORDER — SODIUM CHLORIDE 0.9 % IV SOLN
100.0000 mg | INTRAVENOUS | Status: DC
  Administered 2019-08-03 – 2019-08-04 (×2): 100 mg via INTRAVENOUS
  Filled 2019-08-02 (×2): qty 20

## 2019-08-02 MED ORDER — ACETAMINOPHEN 500 MG PO TABS
1000.0000 mg | ORAL_TABLET | Freq: Once | ORAL | Status: AC
  Administered 2019-08-02: 11:00:00 1000 mg via ORAL
  Filled 2019-08-02: qty 2

## 2019-08-02 MED ORDER — ACETAMINOPHEN 325 MG PO TABS: 650.0000 mg | ORAL_TABLET | Freq: Four times a day (QID) | ORAL | Status: DC | PRN

## 2019-08-02 MED ORDER — AMLODIPINE BESYLATE 5 MG PO TABS: 5.0000 mg | ORAL_TABLET | Freq: Every day | ORAL | Status: DC

## 2019-08-02 MED ORDER — DEXAMETHASONE SODIUM PHOSPHATE 10 MG/ML IJ SOLN
10.0000 mg | Freq: Once | INTRAMUSCULAR | Status: AC
  Administered 2019-08-02: 10 mg via INTRAVENOUS
  Filled 2019-08-02: qty 1

## 2019-08-02 MED ORDER — DEXAMETHASONE SODIUM PHOSPHATE 10 MG/ML IJ SOLN
6.0000 mg | INTRAMUSCULAR | Status: DC
  Administered 2019-08-03 – 2019-08-04 (×2): 6 mg via INTRAVENOUS
  Filled 2019-08-02 (×2): qty 1

## 2019-08-02 MED ORDER — HYDROCOD POLST-CPM POLST ER 10-8 MG/5ML PO SUER: 5.0000 mL | Freq: Two times a day (BID) | ORAL | Status: DC | PRN

## 2019-08-02 MED ORDER — HYDROCODONE-HOMATROPINE 5-1.5 MG/5ML PO SYRP
5.0000 mL | ORAL_SOLUTION | Freq: Four times a day (QID) | ORAL | Status: DC | PRN
  Administered 2019-08-02 – 2019-08-03 (×2): 5 mL via ORAL
  Filled 2019-08-02 (×2): qty 5

## 2019-08-02 MED ORDER — POTASSIUM CHLORIDE 20 MEQ PO PACK
20.0000 meq | PACK | Freq: Once | ORAL | Status: DC
  Filled 2019-08-02: qty 1

## 2019-08-02 MED ORDER — DEXAMETHASONE 6 MG PO TABS: 6.0000 mg | ORAL_TABLET | ORAL | Status: DC

## 2019-08-02 MED ORDER — HYDROCODONE-HOMATROPINE 5-1.5 MG/5ML PO SYRP
10.0000 mL | ORAL_SOLUTION | Freq: Four times a day (QID) | ORAL | Status: DC | PRN
  Administered 2019-08-02: 11:00:00 10 mL via ORAL
  Filled 2019-08-02: qty 10

## 2019-08-02 MED ORDER — ALBUTEROL SULFATE HFA 108 (90 BASE) MCG/ACT IN AERS
2.0000 | INHALATION_SPRAY | RESPIRATORY_TRACT | Status: DC | PRN
  Filled 2019-08-02: qty 6.7

## 2019-08-02 MED ORDER — SODIUM CHLORIDE 0.9 % IV SOLN
1000.0000 mL | INTRAVENOUS | Status: DC
  Administered 2019-08-02: 11:00:00 1000 mL via INTRAVENOUS

## 2019-08-02 MED ORDER — ALBUMIN HUMAN 25 % IV SOLN
12.5000 g | Freq: Four times a day (QID) | INTRAVENOUS | Status: DC
  Administered 2019-08-02: 16:00:00 12.5 g via INTRAVENOUS
  Filled 2019-08-02 (×3): qty 50

## 2019-08-02 MED ORDER — HYDRALAZINE HCL 25 MG PO TABS: 25.0000 mg | ORAL_TABLET | Freq: Four times a day (QID) | ORAL | Status: DC | PRN

## 2019-08-02 MED ORDER — ENOXAPARIN SODIUM 40 MG/0.4ML ~~LOC~~ SOLN
40.0000 mg | SUBCUTANEOUS | Status: DC
  Administered 2019-08-02 – 2019-08-03 (×2): 40 mg via SUBCUTANEOUS
  Filled 2019-08-02 (×2): qty 0.4

## 2019-08-02 NOTE — ED Provider Notes (Signed)
[facility] EMERGENCY DEPARTMENT Provider Note   CSN: [id number] Arrival date & time: 08/02/19  3235     History Chief Complaint  Patient presents with  . Shortness of Breath    Johnny Knight is a 44 y.o. male.  HPI Patient seen day before yesterday and diagnosed with Covid-19 pneumonia.  He had had exposure 13 days prior to presentation.  He had been at a church event whereupon 24 other people got ill.  Symptoms started 6 days ago.  Since yesterday his oxygen saturations have dropped.  He reports he measured oxygen saturations at home in the high 70s.  Cough has gotten much worse.  Patient reports he feels short of breath.  He is having hot and cold episodes.  No chest pain.  No vomiting.  No abdominal pain.  No lower extremity swelling or calf pain.  On EMS arrival, patient was 83% on room air.  They put him on a nonrebreather mask.    Past Medical History:  Diagnosis Date  . Hypertension   . Obesity (BMI 30-39.9)     Patient Active Problem List   Diagnosis Date Noted  . ARTHRITIS, SHOULDER 04/13/2009  . 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 CHOLECYSTECTOMY    . SHOULDER SURGERY         No family history on file.  Social History   Tobacco Use  . Smoking status: Never Smoker  . Smokeless tobacco: Never Used  Substance Use Topics  . Alcohol use: No  . Drug use: No    Home Medications Prior to Admission medications   Medication Sig Start Date End Date Taking? Authorizing Provider  chlorpheniramine-HYDROcodone (TUSSIONEX PENNKINETIC ER) 10-8 MG/5ML SUER Take 5 mLs by mouth every 12 (twelve) hours as needed for cough. 07/31/19   Margarita Grizzle, MD  metaxalone (SKELAXIN) 800 MG tablet Take 1 tablet (800 mg total) by mouth 3 (three) times daily. 12/02/14   Linna Hoff, MD    Allergies    Niacin  Review of Systems   Review of Systems 10 Systems reviewed and are  negative for acute change except as noted in the HPI.  Physical Exam Updated Vital Signs There were no vitals taken for this visit.  Physical Exam Constitutional:      Comments: Patient is alert and appropriate.  Mental status is clear.  He is tachypneic and having harsh cough paroxysms.  He is speaking in full sentences.  Color is good.  Mental status is alert.  HENT:     Head: Normocephalic and atraumatic.  Eyes:     Extraocular Movements: Extraocular movements intact.     Conjunctiva/sclera: Conjunctivae normal.  Cardiovascular:     Comments:  Tachycardia.  No gross rub murmur gallop.  Difficult auscultation due to coughing paroxysmal. Pulmonary:     Comments: Tachypnea.  Harsh cough.  Crackles in mid lung fields. Abdominal:     General: There is no distension.     Palpations: Abdomen is soft.     Tenderness: There is no abdominal tenderness. There is no guarding.  Musculoskeletal:        General: No swelling or tenderness. Normal range of motion.  Skin:    General: Skin is warm and dry.  Neurological:     General: No focal deficit present.     Mental Status: He is oriented to person, place, and time.     Cranial Nerves: No  cranial nerve deficit.     Coordination: Coordination normal.  Psychiatric:        Mood and Affect: Mood normal.     ED Results / Procedures / Treatments   Labs (all labs ordered are listed, but only abnormal results are displayed) Labs Reviewed  CULTURE, BLOOD (ROUTINE X 2)  CULTURE, BLOOD (ROUTINE X 2)  LACTIC ACID, PLASMA  LACTIC ACID, PLASMA  CBC WITH DIFFERENTIAL/PLATELET  COMPREHENSIVE METABOLIC PANEL  D-DIMER, QUANTITATIVE (NOT AT [facility])  PROCALCITONIN  LACTATE DEHYDROGENASE  FERRITIN  TRIGLYCERIDES  FIBRINOGEN  C-REACTIVE PROTEIN    EKG None  Radiology DG Chest Port 1 View  Result Date: 07/31/2019 CLINICAL DATA:  Nonproductive cough and shortness of breath. Hypoxia. COVID-19 virus infection. EXAM: PORTABLE CHEST 1 VIEW  COMPARISON:  03/22/2011 FINDINGS: The heart size and mediastinal contours are within normal limits. Low lung volumes are noted. Patchy areas of opacity are seen in the peripheral lung bases bilaterally, suspicious for viral pneumonia. No evidence of pleural effusion. IMPRESSION: Patchy peripheral bibasilar pulmonary opacities, suspicious for viral pneumonia. Electronically Signed   By: Danae Orleans M.D.   On: 07/31/2019 11:28    Procedures Procedures (including critical care time) CRITICAL CARE Performed by: Arby Barrette   Total critical care time: 30 minutes  Critical care time was exclusive of separately billable procedures and treating other patients.  Critical care was necessary to treat or prevent imminent or life-threatening deterioration.  Critical care was time spent personally by me on the following activities: development of treatment plan with patient and/or surrogate as well as nursing, discussions with consultants, evaluation of patient's response to treatment, examination of patient, obtaining history from patient or surrogate, ordering and performing treatments and interventions, ordering and review of laboratory studies, ordering and review of radiographic studies, pulse oximetry and re-evaluation of patient's condition. Medications Ordered in ED Medications  0.9 %  sodium chloride infusion (has no administration in time range)  dexamethasone (DECADRON) injection 10 mg (has no administration in time range)  HYDROcodone-homatropine (HYCODAN) 5-1.5 MG/5ML syrup 10 mL (has no administration in time range)  albuterol (VENTOLIN HFA) 108 (90 Base) MCG/ACT inhaler 2 puff (has no administration in time range)  acetaminophen (TYLENOL) tablet 1,000 mg (has no administration in time range)    ED Course  I have reviewed the triage vital signs and the nursing notes.  Pertinent labs & imaging results that were available during my care of the patient were reviewed by me and considered  in my medical decision making (see chart for details).    MDM Rules/Calculators/A&P                      Patient has worsening Covid pneumonia with hypoxia.  His mental status is clear.  He does have tachypnea but does not show signs of impending respiratory failure.  Patient is on a nonrebreather mask with oxygen saturations in the mid 90s.  We will proceed with treatment with remdesivir, Decadron, symptomatic management with Tylenol, albuterol, Hycodan.  plan for admission for inpatient treatment.  Johnny Knight was evaluated in Emergency Department on 08/02/2019 for the symptoms described in the history of present illness. He was evaluated in the context of the global COVID-19 pandemic, which necessitated consideration that the patient might be at risk for infection with the SARS-CoV-2 virus that causes COVID-19. Institutional protocols and algorithms that pertain to the evaluation of patients at risk for COVID-19 are in a state of rapid change based on  information released by regulatory bodies including the CDC and federal and state organizations. These policies and algorithms were followed during the patient's care in the ED. Final Clinical Impression(s) / ED Diagnoses Final diagnoses:  Pneumonia due to COVID-19 virus  Hypoxia    Rx / DC Orders ED Discharge Orders    None       Charlesetta Shanks, MD 08/02/19 1026

## 2019-08-02 NOTE — ED Provider Notes (Signed)
  [facility] EMERGENCY DEPARTMENT Provider Note   CSN: [id number] Arrival date & time: 08/02/19  3244     History No chief complaint on file.   [name] is a 44 y.o. male.  HPI There is a complete dictated note for this patient for the same date.  I am unable to deleted this duplicate open note. Disregard this open note.      Arby Barrette, MD 08/09/19 1626

## 2019-08-02 NOTE — ED Triage Notes (Signed)
Patient presents to ED via [facility]  C/o sob states he was dx. With covid on Sat. States he gets very sob at night, c/o chest wall pain states his chest hurts worse with cough and inspiration. Dry cough. Denies fever c/o decreased appetite

## 2019-08-02 NOTE — ED Notes (Signed)
Phil ([facility]Transfer to [facility]) called @ 1312-per Tresa Endo, RN called by Marylene Land

## 2019-08-02 NOTE — Progress Notes (Signed)
Acceptance Note    Johnny Knight  [id number] DOB: 08-May-1976 DOA: 08/02/2019 PCP: Enid Skeens., MD    Brief Narrative:  44 year old male who presented with dyspnea and cough.  He does have significant past medical history for hypertension.  He was diagnosed with COVID-19 January 30, his symptoms were consistent with cough and dyspnea for about 5 days.  As an outpatient he was diagnosed with Covid pneumonia and was referred for outpatient monoclonal antibody infusion.  Over last 24 hours prior to hospitalization he had worsening symptoms, severe dyspnea, cough, and chills.  Oxygen saturation in the high 70s.  Because worsening symptoms he returned to the emergency department instead of going to outpatient infusion clinic.  On his initial physical examination his oximetry was 83% on room air, blood pressure 118/74, heart rate 74, respiratory rate 22, temperature 98.6.  His lungs were clear to auscultation bilaterally, heart S1-S2 present rhythmic, soft abdomen, no lower extremity edema. Sodium 141, potassium 3.4, chloride 105, bicarb 20, glucose 97, BUN 12, creatinine 1.49.  White count 5.2, hemoglobin 13.6, hematocrit 37.9, platelets 229.  Chest radiograph with bibasilar interstitial infiltrates, predominantly left lower lobe, worsening compared to film 24 hours prior.  Patient was admitted to the hospital with a working diagnosis of acute hypoxic respiratory failure due to SARS COVID-19 viral pneumonia.  Assessment & Plan:   Active Problems:   COVID-19 virus infection   Pneumonia due to COVID-19 virus   1.  Acute hypoxic respiratory failure due to SARS COVID-19 viral pneumonia.  RR: 20 to 21  Pulse oxymetry: 91 to 95%  Fi02: 21% room air.  COVID-19 Labs  Recent Labs    08/02/19 1007  DDIMER 1.34*  FERRITIN 486*  LDH 366*  CRP 11.1*    No results found for: SARSCOV2NAA   1. Remdesivir 2. IV dexamethasone 3. Antitussive agents 4. Airway clearing techniques with  flutter valve and incentive spirometer 5. Out of bed to chair tid with meals 6. Follow with inflammatory markers. 7. Follow a restrictive IV fluids strategy. 8. Hold antihypertensive agents.         Objective: Vitals:   08/02/19 1213 08/02/19 1300 08/02/19 1330 08/02/19 1557  BP: 106/67 109/69 132/83 109/69  Pulse: 81 62 66   Resp: (!) 22 17 (!) 21   Temp:      TempSrc:    Oral  SpO2: 93% 94% 91%   Weight:      Height:       No intake or output data in the 24 hours ending 08/02/19 1602 Filed Weights   08/02/19 1020  Weight: 95.3 kg       Data Reviewed: I have personally reviewed following labs and imaging studies  CBC: Recent Labs  Lab 08/02/19 1007  WBC 5.2  NEUTROABS 3.7  HGB 13.6  HCT 37.9*  MCV 93.6  PLT 001   Basic Metabolic Panel: Recent Labs  Lab 08/02/19 1007  NA 141  K 3.4*  CL 105  CO2 20*  GLUCOSE 97  BUN 12  CREATININE 1.49*  CALCIUM 8.3*   GFR: Estimated Creatinine Clearance: 74.1 mL/min (A) (by C-G formula based on SCr of 1.49 mg/dL (H)). Liver Function Tests: Recent Labs  Lab 08/02/19 1007  AST 82*  ALT 72*  ALKPHOS 100  BILITOT 1.8*  PROT 6.4*  ALBUMIN 3.1*   No results for input(s): LIPASE, AMYLASE in the last 168 hours. No results for input(s): AMMONIA in the last 168 hours. Coagulation Profile: No results  for input(s): INR, PROTIME in the last 168 hours. Cardiac Enzymes: Recent Labs  Lab 08/02/19 1309  CKTOTAL 90   BNP (last 3 results) No results for input(s): PROBNP in the last 8760 hours. HbA1C: No results for input(s): HGBA1C in the last 72 hours. CBG: No results for input(s): GLUCAP in the last 168 hours. Lipid Profile: Recent Labs    08/02/19 1007  TRIG 123   Thyroid Function Tests: No results for input(s): TSH, T4TOTAL, FREET4, T3FREE, THYROIDAB in the last 72 hours. Anemia Panel: Recent Labs    08/02/19 1007  FERRITIN 486*      Radiology Studies: I have reviewed all of the imaging during  this hospital visit personally     Scheduled Meds: . albuterol  2 puff Inhalation Q6H  . benzonatate  200 mg Oral TID  . [START ON 08/03/2019] dexamethasone  6 mg Oral Q24H  . enoxaparin (LOVENOX) injection  40 mg Subcutaneous Q24H  . potassium chloride  20 mEq Oral Once   Continuous Infusions: . sodium chloride 1,000 mL (08/02/19 1041)  . albumin human 12.5 g (08/02/19 1538)  . [START ON 08/03/2019] remdesivir 100 mg in NS 100 mL       LOS: 0 days        Koleman Marling Annett Gula, MD

## 2019-08-02 NOTE — Plan of Care (Signed)
  Problem: Education: Goal: Knowledge of risk factors and measures for prevention of condition will improve Outcome: Progressing   Problem: Coping: Goal: Psychosocial and spiritual needs will be supported Outcome: Progressing   Problem: Respiratory: Goal: Will maintain a patent airway Outcome: Progressing Goal: Complications related to the disease process, condition or treatment will be avoided or minimized Outcome: Progressing   

## 2019-08-02 NOTE — Progress Notes (Signed)
Pt admitted to room 138 from [facility] via EMS transport. Pt AO X4. Pt oriented to room and callbell.  Report received from [facility]. 02@99 % on NRB, no respiratory distress observed. Will attempt to wean supplemental 02. Pt speaking w/spouse on phone at this time. Nothing further to not at this time.

## 2019-08-02 NOTE — ED Notes (Signed)
Lunch Tray Ordered @ 1300. 

## 2019-08-02 NOTE — ED Notes (Signed)
States he is feeling a little better, not coughing as long as he doesn't talk

## 2019-08-02 NOTE — Progress Notes (Signed)
Pt opted to have wallet placed in safe  Contents of wallet include  7- $1 bills 1- $5 bill 1- $10 bill 4 credit cards 1 insurance card 1 [employer] card 1 driver's license 1 concealed carry permit 1 check

## 2019-08-02 NOTE — H&P (Signed)
History and Physical    Johnny Knight [id number] DOB: 07/03/1975 DOA: 08/02/2019  PCP: Enid Skeens., MD   Patient coming from: Home  I have personally briefly reviewed patient's old medical records in [place]  Chief Complaint: Cough, short of breath  HPI: Johnny Knight is a 44 y.o. male with medical history significant of HTN presented with increasing short of breath and cough. He has had COVID exposure at his work and church. He himself started to have cough and short of breath since about 5 days ago and came to ED two days ago. Work-up at that time showed early bilateral infiltrates and no significant hypoxia. He was told to check his PulsOx at home BID and was send for outpt monoclonal ABX starting today supposingly. diagnosed with Covid-19 pneumonia.  But since yesterday his oxygen saturations have dropped to often below 90%.  He reports he measured oxygen saturations at home in the high 70s.  Cough has gotten much worse.  Patient reports he feels short of breath.  He is having hot and cold episodes.  No chest pain.  No vomiting.  No abdominal pain.  No lower extremity swelling or calf pain.  On EMS arrival, patient was 83% on room air.  They put him on a nonrebreather mask.  ED Course: X-ray showing worsening of B/L PNA and hypoxia, stabilized with NRB.  Review of Systems: As per HPI otherwise 10 point review of systems negative.    Past Medical History:  Diagnosis Date  . Hypertension   . Obesity (BMI 30-39.9)     Past Surgical History:  Procedure Laterality Date  . CHOLECYSTECTOMY    . SHOULDER SURGERY       reports that he has never smoked. He has never used smokeless tobacco. He reports that he does not drink alcohol or use drugs.  Allergies  Allergen Reactions  . Niacin Other (See Comments)    unk    HTN ran in his family   Prior to Admission medications   Medication Sig Start Date End Date Taking? Authorizing Provider  amLODipine  (NORVASC) 5 MG tablet Take 5 mg by mouth daily. 07/09/19  Yes [provider]  atenolol (TENORMIN) 25 MG tablet Take 25 mg by mouth daily. 07/14/19  Yes [provider]  chlorpheniramine-HYDROcodone (TUSSIONEX PENNKINETIC ER) 10-8 MG/5ML SUER Take 5 mLs by mouth every 12 (twelve) hours as needed for cough. 07/31/19  Yes Pattricia Boss, MD  amoxicillin-clavulanate (AUGMENTIN) 875-125 MG tablet Take 1 tablet by mouth 2 (two) times daily. For ten days 07/20/19   [provider]    Physical Exam: Vitals:   08/02/19 1019 08/02/19 1020 08/02/19 1213  BP: 118/74  106/67  Pulse: 74  81  Resp: (!) 22  (!) 22  Temp: 98.6 F (37 C)    TempSrc: Oral    SpO2: 100%  93%  Weight:  95.3 kg   Height:  5\' 10"  (1.778 m)     Constitutional: NAD, calm, comfortable Vitals:   08/02/19 1019 08/02/19 1020 08/02/19 1213  BP: 118/74  106/67  Pulse: 74  81  Resp: (!) 22  (!) 22  Temp: 98.6 F (37 C)    TempSrc: Oral    SpO2: 100%  93%  Weight:  95.3 kg   Height:  5\' 10"  (1.778 m)    Eyes: PERRL, lids and conjunctivae normal ENMT: Mucous membranes are moist. Posterior pharynx clear of any exudate or lesions.Normal dentition.  Neck: normal,  supple, no masses, no thyromegaly Respiratory: clear to auscultation bilaterally, no wheezing, no crackles. increased respiratory effort. Talking in broken sentences.  Cardiovascular: Regular rate and rhythm, no murmurs / rubs / gallops. No extremity edema. 2+ pedal pulses. No carotid bruits.  Abdomen: no tenderness, no masses palpated. No hepatosplenomegaly. Bowel sounds positive.  Musculoskeletal: no clubbing / cyanosis. No joint deformity upper and lower extremities. Good ROM, no contractures. Normal muscle tone.  Skin: no rashes, lesions, ulcers. No induration Neurologic: CN 2-12 grossly intact. Sensation intact, DTR normal. Strength 5/5 in all 4.  Psychiatric: Normal judgment and insight. Alert and oriented x 3. Normal mood.     Labs on  Admission: I have personally reviewed following labs and imaging studies  CBC: Recent Labs  Lab 08/02/19 1007  WBC 5.2  NEUTROABS 3.7  HGB 13.6  HCT 37.9*  MCV 93.6  PLT 229   Basic Metabolic Panel: Recent Labs  Lab 08/02/19 1007  NA 141  K 3.4*  CL 105  CO2 20*  GLUCOSE 97  BUN 12  CREATININE 1.49*  CALCIUM 8.3*   GFR: Estimated Creatinine Clearance: 74.1 mL/min (A) (by C-G formula based on SCr of 1.49 mg/dL (H)). Liver Function Tests: Recent Labs  Lab 08/02/19 1007  AST 82*  ALT 72*  ALKPHOS 100  BILITOT 1.8*  PROT 6.4*  ALBUMIN 3.1*   No results for input(s): LIPASE, AMYLASE in the last 168 hours. No results for input(s): AMMONIA in the last 168 hours. Coagulation Profile: No results for input(s): INR, PROTIME in the last 168 hours. Cardiac Enzymes: No results for input(s): CKTOTAL, CKMB, CKMBINDEX, TROPONINI in the last 168 hours. BNP (last 3 results) No results for input(s): PROBNP in the last 8760 hours. HbA1C: No results for input(s): HGBA1C in the last 72 hours. CBG: No results for input(s): GLUCAP in the last 168 hours. Lipid Profile: Recent Labs    08/02/19 1007  TRIG 123   Thyroid Function Tests: No results for input(s): TSH, T4TOTAL, FREET4, T3FREE, THYROIDAB in the last 72 hours. Anemia Panel: Recent Labs    08/02/19 1007  FERRITIN 486*   Urine analysis:    Component Value Date/Time   COLORURINE YELLOW 03/22/2011 2222   APPEARANCEUR CLEAR 03/22/2011 2222   LABSPEC 1.020 03/22/2011 2222   PHURINE 6.0 03/22/2011 2222   GLUCOSEU NEGATIVE 03/22/2011 2222   HGBUR TRACE (A) 03/22/2011 2222   BILIRUBINUR NEGATIVE 03/22/2011 2222   KETONESUR NEGATIVE 03/22/2011 2222   PROTEINUR NEGATIVE 03/22/2011 2222   UROBILINOGEN 0.2 03/22/2011 2222   NITRITE NEGATIVE 03/22/2011 2222   LEUKOCYTESUR NEGATIVE 03/22/2011 2222    Radiological Exams on Admission: DG Chest Port 1 View  Result Date: 08/02/2019 CLINICAL DATA:  COVID-19 positive  EXAM: PORTABLE CHEST 1 VIEW COMPARISON:  07/31/2019 FINDINGS: Patchy bilateral pneumonia which has progressed. Lung volumes are low. Normal heart size and mediastinal contours. Artifact from EKG leads. IMPRESSION: Progressive bilateral pulmonary infiltrate. Electronically Signed   By: Marnee Spring M.D.   On: 08/02/2019 10:47    EKG: Independently pending  Assessment/Plan Active Problems:   COVID-19 virus infection   COVID-19  Acute hypoxic respiratory failure secondary to COVID-19 pneumonia Remdesivir and steroid started Breathing meds, around clock and as needed. Encourage frequent proning as tolerated. admit to [facility] D-dimer elevated, will trend. DVT prophylaxis doses of Lovenox for now.  Borderline hypotension, Hold home BP meds PRN hydralazine for now.  AKI Likely from dehydration from being sick and unable to catch up p.o.  intake Albumin every 6H for hydration.   DVT prophylaxis: Lovenox Code Status: Full code Family Communication: None at bedside Disposition Plan: Once oxygenation improved, can be discharged home. Consults called: None Admission status: Telemetry admission   Emeline General MD [facility] Pager [phone]   If 7PM-7AM, please contact night-coverage [email or web address] Password [facility]  08/02/2019, 12:53 PM

## 2019-08-03 DIAGNOSIS — J9601 Acute respiratory failure with hypoxia: Secondary | ICD-10-CM

## 2019-08-03 DIAGNOSIS — N179 Acute kidney failure, unspecified: Secondary | ICD-10-CM

## 2019-08-03 DIAGNOSIS — I10 Essential (primary) hypertension: Secondary | ICD-10-CM

## 2019-08-03 LAB — COMPREHENSIVE METABOLIC PANEL
ALT: 76 U/L — ABNORMAL HIGH (ref 0–44)
AST: 70 U/L — ABNORMAL HIGH (ref 15–41)
Albumin: 3.2 g/dL — ABNORMAL LOW (ref 3.5–5.0)
Alkaline Phosphatase: 94 U/L (ref 38–126)
Anion gap: 10 (ref 5–15)
BUN: 17 mg/dL (ref 6–20)
CO2: 23 mmol/L (ref 22–32)
Calcium: 8.7 mg/dL — ABNORMAL LOW (ref 8.9–10.3)
Chloride: 106 mmol/L (ref 98–111)
Creatinine, Ser: 1.07 mg/dL (ref 0.61–1.24)
GFR calc Af Amer: >60 mL/min (ref >60)
GFR calc non Af Amer: >60 mL/min (ref >60)
Glucose, Bld: 119 mg/dL — ABNORMAL HIGH (ref 70–99)
Potassium: 3.5 mmol/L (ref 3.5–5.1)
Sodium: 139 mmol/L (ref 135–145)
Total Bilirubin: 1.4 mg/dL — ABNORMAL HIGH (ref 0.3–1.2)
Total Protein: 6.6 g/dL (ref 6.5–8.1)

## 2019-08-03 LAB — D-DIMER, QUANTITATIVE: D-Dimer, Quant: 0.92 ug/mL-FEU — ABNORMAL HIGH (ref 0.00–0.50)

## 2019-08-03 LAB — FERRITIN: Ferritin: 485 ng/mL — ABNORMAL HIGH (ref 24–336)

## 2019-08-03 LAB — C-REACTIVE PROTEIN: CRP: 12 mg/dL — ABNORMAL HIGH (ref <1.0)

## 2019-08-03 MED ORDER — FLUTICASONE PROPIONATE 50 MCG/ACT NA SUSP
1.0000 | Freq: Two times a day (BID) | NASAL | Status: DC
  Administered 2019-08-03 – 2019-08-04 (×3): 1 via NASAL
  Filled 2019-08-03: qty 16

## 2019-08-03 MED ORDER — MENTHOL 3 MG MT LOZG
1.0000 | LOZENGE | OROMUCOSAL | Status: DC
  Administered 2019-08-03 – 2019-08-04 (×4): 3 mg via ORAL
  Filled 2019-08-03: qty 9

## 2019-08-03 MED ORDER — BOOST / RESOURCE BREEZE PO LIQD CUSTOM
1.0000 | Freq: Three times a day (TID) | ORAL | Status: DC
  Administered 2019-08-04: 09:00:00 1 via ORAL
  Filled 2019-08-03 (×4): qty 1

## 2019-08-03 MED ORDER — LOPERAMIDE HCL 2 MG PO CAPS
2.0000 mg | ORAL_CAPSULE | ORAL | Status: DC | PRN
  Administered 2019-08-03: 2 mg via ORAL
  Filled 2019-08-03: qty 1

## 2019-08-03 MED ORDER — HYDROCOD POLST-CPM POLST ER 10-8 MG/5ML PO SUER
5.0000 mL | Freq: Two times a day (BID) | ORAL | Status: DC
  Administered 2019-08-03 – 2019-08-04 (×3): 5 mL via ORAL
  Filled 2019-08-03 (×3): qty 5

## 2019-08-03 NOTE — Progress Notes (Signed)
Ambulation Note  Saturation Pre: 98% on RA  Ambulation Distance: 100 ft  Saturation During Ambulation: 93-96% on RA  Notes: Pt had several violent coughing spells while walking which limited the duration of our walk. Pt walked independently. Pt returned to recliner with call bell within reach, SpO2 98% on RA. Will walk with pt again after lunch if time permits per his request.   Alisia Ferrari, MS, ACSM CEP 11:11 AM 08/03/2019

## 2019-08-03 NOTE — Progress Notes (Signed)
PROGRESS NOTE    Johnny Knight  QVZ:[id number] DOB: 08-07-1975 DOA: 08/02/2019 PCP: Nonnie Done., MD    Brief Narrative:  44 year old male who presented with dyspnea and cough.  He does have significant past medical history for hypertension.  He was diagnosed with COVID-19 January 30, his symptoms were consistent with cough and dyspnea for about 5 days.  As an outpatient he was diagnosed with Covid pneumonia and was referred for outpatient monoclonal antibody infusion.  Over last 24 hours prior to hospitalization he had worsening symptoms, severe dyspnea, cough, and chills.  Oxygen saturation in the high 70s.  Because worsening symptoms he returned to the emergency department instead of going to outpatient infusion clinic.  On his initial physical examination his oximetry was 83% on room air, blood pressure 118/74, heart rate 74, respiratory rate 22, temperature 98.6.  His lungs were clear to auscultation bilaterally, heart S1-S2 present rhythmic, soft abdomen, no lower extremity edema. Sodium 141, potassium 3.4, chloride 105, bicarb 20, glucose 97, BUN 12, creatinine 1.49.  White count 5.2, hemoglobin 13.6, hematocrit 37.9, platelets 229.  Chest radiograph with bibasilar interstitial infiltrates, predominantly left lower lobe, worsening compared to film 24 hours prior.  Patient was admitted to the hospital with a working diagnosis of acute hypoxic respiratory failure due to SARS COVID-19 viral pneumonia.  Patient has been responding well to medical therapy with remdesivir and systemic corticosteroids.    Assessment & Plan:   Principal Problem:   Pneumonia due to COVID-19 virus Active Problems:   COVID-19 virus infection   Essential hypertension   Acute respiratory failure with hypoxia (HCC)   AKI (acute kidney injury) (HCC)    1.  Acute hypoxic respiratory failure due to SARS COVID-19 viral pneumonia.  RR: 16  Pulse oxymetry: 94%  Fi02: 21% room air.  COVID-19  Labs  Recent Labs    08/02/19 1007 08/03/19 0326  DDIMER 1.34* 0.92*  FERRITIN 486* 485*  LDH 366*  --   CRP 11.1* 12.0*    No results found for: SARSCOV2NAA  Stable inflammatory markers.   Patient tolerating well medical therapy with Remdesivir #2/5 (AST 70, ALT 76) and systemic steroids with dexamethasone 6 mg Iv q 24H. Patient with persistent cough, will add nasal fluticasone and cepacol lozenges, continue guaifenesin dm and chlorpheniramine with codeine.   On bronchodilators and irway clearing techniques with incentive spirometer and flutter valve. Out of bed to chair tid with meals, physical therapy and occupational therapy.   2. Viral diarrhea. Will add as needed loperamide.  3. AKI. Renal function with serum cr at 1,0 with K at 3,5 and serum bicarbonate at 23. Follow on renal panel in am, avoid hypotension and nephrotoxic medications. Continue to hold on IV fluids, follow a restrictive IV fluids strategy.   4. HTN. Blood pressure has been stable, continue to hold on antihypertensive agents.   DVT prophylaxis: enoxaparin   Code Status:  full Family Communication: no family at the bedside Disposition Plan/ discharge barriers: Plan to complet antiviral therapy, but if continue to improve possible candidate for outpatient Remdesivir infusion center.    Subjective: Patient continue to have persistent cough and diarrhea, dyspnea has been improving but not yet back to baseline, no nausea or vomiting. No chest pain. His mother is being hospitalized today for COVID 19 at [facility].   Objective: Vitals:   08/02/19 1950 08/02/19 2359 08/03/19 0300 08/03/19 0750  BP: 102/67 (!) 110/50 111/71 99/63  Pulse: 70 69  61  Resp: 17 11 18 16   Temp: 98 F (36.7 C) 98.1 F (36.7 C) 98.2 F (36.8 C) 97.9 F (36.6 C)  TempSrc: Oral Oral Oral Oral  SpO2: 93% 99% 90% 94%  Weight:      Height:        Intake/Output Summary (Last 24 hours) at 08/03/2019 0842 Last data filed  at 08/03/2019 0000 Gross per 24 hour  Intake 1318.44 ml  Output --  Net 1318.44 ml   Filed Weights   08/02/19 1020  Weight: 95.3 kg    Examination:   General: not in pain or dyspnea, persistent cough.  Neurology: Awake and alert, non focal  E ENT: mi,d pallor, no icterus, oral mucosa moist Cardiovascular: No JVD. S1-S2 present, rhythmic. Pulmonary: positive breath sounds bilaterally. Gastrointestinal. Abdomen with no organomegaly, non tender, no rebound or guarding Skin. No rashes Musculoskeletal: no joint deformities     Data Reviewed: I have personally reviewed following labs and imaging studies  CBC: Recent Labs  Lab 08/02/19 1007  WBC 5.2  NEUTROABS 3.7  HGB 13.6  HCT 37.9*  MCV 93.6  PLT 423   Basic Metabolic Panel: Recent Labs  Lab 08/02/19 1007 08/03/19 0326  NA 141 139  K 3.4* 3.5  CL 105 106  CO2 20* 23  GLUCOSE 97 119*  BUN 12 17  CREATININE 1.49* 1.07  CALCIUM 8.3* 8.7*   GFR: Estimated Creatinine Clearance: 103.1 mL/min (by C-G formula based on SCr of 1.07 mg/dL). Liver Function Tests: Recent Labs  Lab 08/02/19 1007 08/03/19 0326  AST 82* 70*  ALT 72* 76*  ALKPHOS 100 94  BILITOT 1.8* 1.4*  PROT 6.4* 6.6  ALBUMIN 3.1* 3.2*   No results for input(s): LIPASE, AMYLASE in the last 168 hours. No results for input(s): AMMONIA in the last 168 hours. Coagulation Profile: No results for input(s): INR, PROTIME in the last 168 hours. Cardiac Enzymes: Recent Labs  Lab 08/02/19 1309  CKTOTAL 90   BNP (last 3 results) No results for input(s): PROBNP in the last 8760 hours. HbA1C: No results for input(s): HGBA1C in the last 72 hours. CBG: No results for input(s): GLUCAP in the last 168 hours. Lipid Profile: Recent Labs    08/02/19 1007  TRIG 123   Thyroid Function Tests: No results for input(s): TSH, T4TOTAL, FREET4, T3FREE, THYROIDAB in the last 72 hours. Anemia Panel: Recent Labs    08/02/19 1007 08/03/19 0326  FERRITIN 486*  485*      Radiology Studies: I have reviewed all of the imaging during this hospital visit personally     Scheduled Meds: . dexamethasone (DECADRON) injection  6 mg Intravenous Q24H  . enoxaparin (LOVENOX) injection  40 mg Subcutaneous Q24H   Continuous Infusions: . remdesivir 100 mg in NS 100 mL       LOS: 1 day        Galen Russman Gerome Apley, MD

## 2019-08-03 NOTE — Progress Notes (Signed)
Initial Nutrition Assessment  RD working remotely.  DOCUMENTATION CODES:   Obesity unspecified  INTERVENTION:   Boost Breeze po TID, each supplement provides 250 kcal and 9 grams of protein  Liberalize diet to encourage PO intake  Encourage adequate PO intake   NUTRITION DIAGNOSIS:   Increased nutrient needs related to acute illness(COVID) as evidenced by estimated needs.    GOAL:   Patient will meet greater than or equal to 90% of their needs    MONITOR:   PO intake, Supplement acceptance, Weight trends, Labs, I & O's  REASON FOR ASSESSMENT:   Malnutrition Screening Tool    ASSESSMENT:   Pt with a PMH significant for HTN who was dx with COVID-19 on 1/30 and referred for outpatient tx. Pt presented to the ED on 2/1 with worsening cough, chills, and severe dyspnea.   Diet Order: 2 gram Na (liberalized to regular) PO intake: 0-50% x3 recorded meals since admission  Medications reviewed and include: Decadron  Labs reviewed.  I/O: +1,518.58mL since admit (no UOP documented)  No prior wt hx available for review.   NUTRITION - FOCUSED PHYSICAL EXAM:  Deferred, RD working remotely.   Diet Order:   Diet Order            Diet regular Room service appropriate? Yes; Fluid consistency: Thin  Diet effective now              EDUCATION NEEDS:   No education needs have been identified at this time  Skin:  Skin Assessment: Reviewed RN Assessment  Last BM:  2/2 type 7  Height:   Ht Readings from Last 1 Encounters:  08/02/19 5\' 10"  (1.778 m)    Weight:   Wt Readings from Last 1 Encounters:  08/02/19 95.3 kg    Ideal Body Weight:  75.45 kg  BMI:  Body mass index is 30.13 kg/m.  Estimated Nutritional Needs:   Kcal:  1750-1850  Protein:  120-130g  Fluid:  >1.75L/d   09/30/19, MS, RD, LDN Pager: [phone] Weekend/After Hours Pager: [phone]

## 2019-08-04 LAB — COMPREHENSIVE METABOLIC PANEL
ALT: 56 U/L — ABNORMAL HIGH (ref 0–44)
AST: 33 U/L (ref 15–41)
Albumin: 3.3 g/dL — ABNORMAL LOW (ref 3.5–5.0)
Alkaline Phosphatase: 82 U/L (ref 38–126)
Anion gap: 12 (ref 5–15)
BUN: 20 mg/dL (ref 6–20)
CO2: 21 mmol/L — ABNORMAL LOW (ref 22–32)
Calcium: 8.5 mg/dL — ABNORMAL LOW (ref 8.9–10.3)
Chloride: 108 mmol/L (ref 98–111)
Creatinine, Ser: 1.12 mg/dL (ref 0.61–1.24)
GFR calc Af Amer: >60 mL/min (ref >60)
GFR calc non Af Amer: >60 mL/min (ref >60)
Glucose, Bld: 111 mg/dL — ABNORMAL HIGH (ref 70–99)
Potassium: 3.5 mmol/L (ref 3.5–5.1)
Sodium: 141 mmol/L (ref 135–145)
Total Bilirubin: 1.2 mg/dL (ref 0.3–1.2)
Total Protein: 6.5 g/dL (ref 6.5–8.1)

## 2019-08-04 LAB — D-DIMER, QUANTITATIVE: D-Dimer, Quant: 0.6 ug/mL-FEU — ABNORMAL HIGH (ref 0.00–0.50)

## 2019-08-04 LAB — C-REACTIVE PROTEIN: CRP: 5.7 mg/dL — ABNORMAL HIGH (ref <1.0)

## 2019-08-04 LAB — FERRITIN: Ferritin: 612 ng/mL — ABNORMAL HIGH (ref 24–336)

## 2019-08-04 MED ORDER — BENZONATATE 100 MG PO CAPS: 100.0000 mg | ORAL_CAPSULE | Freq: Three times a day (TID) | ORAL | 0 refills | Status: AC | PRN

## 2019-08-04 MED ORDER — IPRATROPIUM-ALBUTEROL 20-100 MCG/ACT IN AERS: 1.0000 | INHALATION_SPRAY | Freq: Four times a day (QID) | RESPIRATORY_TRACT | 1 refills | Status: AC | PRN

## 2019-08-04 MED ORDER — GUAIFENESIN-DM 100-10 MG/5ML PO SYRP: 10.0000 mL | ORAL_SOLUTION | ORAL | 0 refills | Status: AC | PRN

## 2019-08-04 MED ORDER — LOPERAMIDE HCL 2 MG PO CAPS: 2.0000 mg | ORAL_CAPSULE | ORAL | 0 refills | Status: AC | PRN

## 2019-08-04 MED ORDER — MENTHOL 3 MG MT LOZG: 1.0000 | LOZENGE | OROMUCOSAL | 1 refills | Status: AC

## 2019-08-04 MED ORDER — DEXAMETHASONE 6 MG PO TABS: 6.0000 mg | ORAL_TABLET | Freq: Every day | ORAL | 0 refills | Status: AC

## 2019-08-04 NOTE — Progress Notes (Signed)
Patient scheduled for outpatient Remdesivir infusion at 10:00 AM on Thursday 2/4, and Friday 2/5.  Please advise them to report to [facility] at [address]  Drive to the security guard and tell them you are here for an infusion. They will direct you to the front entrance where we will come and get you.  For questions call [phone].  Thanks

## 2019-08-04 NOTE — Discharge Summary (Signed)
Physician Discharge Summary  ISAMI MEHRA KGM:[id number] DOB: [birth year] DOA: 08/02/2019  PCP: Nonnie Done., MD  Admit date: 08/02/2019 Discharge date: 08/04/2019  Admitted From: Home Disposition: Home  Recommendations for Outpatient Follow-up:  44. Follow up with PCP in 1-2 weeks 2. Please obtain BMP/CBC in one week your next doctors visit.  3. Oral prednisone with bronchodilators prescribed. 4. 2 more days of outpatient remdesivir infusion arranged  Discharge Condition: Stable CODE STATUS: Full code Diet recommendation: 2 g salt  Brief/Interim Summary: 44 year old with no significant past medical history besides essential hypertension admitted to the hospital for dyspnea and cough diagnosed with COVID-19 pneumonia.  Upon admission he was 44% on room air therefore admitted.  Chest x-ray showed bilateral infiltrates.  He was started on steroids and remdesivir.  Over the course of 3 days symptomatically he started doing better but still had persistent cough.  With ambulation he was saturating greater than 90% consistently. Outpatient infusion for remdesivir was arranged and was discharged in stable condition.  Acute hypoxic respiratory failure secondary to COVID-19 pneumonia -Hypoxia is resolved.  Saturating greater than 95% on room air even with ambulation. -Complete Decadron course as prescribed.  2 more days of outpatient remdesivir arranged -Bronchodilators prescribed.  Viral diarrhea, improved -Loperamide as needed  Essential hypertension -Resume home meds   Discharge Diagnoses:  Principal Problem:   Pneumonia due to COVID-19 virus Active Problems:   COVID-19 virus infection   Essential hypertension   Acute respiratory failure with hypoxia (HCC)   AKI (acute kidney injury) (HCC)    Consultations:  None  Subjective: Feels great, no complaints besides persistent coughing which is also improved.  Discharge Exam: Vitals:   08/04/19 0443 08/04/19 0801  BP:  108/64 114/70  Pulse: 72 61  Resp: 20 19  Temp: 98.2 F (36.8 C) 98.5 F (36.9 C)  SpO2: 96% 94%   Vitals:   08/03/19 1645 08/03/19 1855 08/04/19 0443 08/04/19 0801  BP: 109/76 117/74 108/64 114/70  Pulse: 62 70 72 61  Resp: 16  20 19   Temp: 98.2 F (36.8 C) 98 F (36.7 C) 98.2 F (36.8 C) 98.5 F (36.9 C)  TempSrc: Oral Oral Oral Oral  SpO2: 100% 95% 96% 94%  Weight:      Height:        General: Pt is alert, awake, not in acute distress Cardiovascular: RRR, S1/S2 +, no rubs, no gallops Respiratory: CTA bilaterally, no wheezing, no rhonchi Abdominal: Soft, NT, ND, bowel sounds + Extremities: no edema, no cyanosis  Discharge Instructions   Allergies as of 08/04/2019      Reactions   Niacin Other (See Comments)   unk      Medication List    TAKE these medications   amLODipine 5 MG tablet Commonly known as: NORVASC Take 5 mg by mouth daily.   amoxicillin-clavulanate 875-125 MG tablet Commonly known as: AUGMENTIN Take 1 tablet by mouth 2 (two) times daily. For ten days   atenolol 25 MG tablet Commonly known as: TENORMIN Take 25 mg by mouth daily.   benzonatate 100 MG capsule Commonly known as: Tessalon Perles Take 1 capsule (100 mg total) by mouth 3 (three) times daily as needed for cough.   chlorpheniramine-HYDROcodone 10-8 MG/5ML Suer Commonly known as: Tussionex Pennkinetic ER Take 5 mLs by mouth every 12 (twelve) hours as needed for cough.   dexamethasone 6 MG tablet Commonly known as: DECADRON Take 1 tablet (6 mg total) by mouth daily for 7 days.  guaiFENesin-dextromethorphan 100-10 MG/5ML syrup Commonly known as: ROBITUSSIN DM Take 10 mLs by mouth every 4 (four) hours as needed for cough.   Ipratropium-Albuterol 20-100 MCG/ACT Aers respimat Commonly known as: COMBIVENT Inhale 1 puff into the lungs every 6 (six) hours as needed for wheezing.   loperamide 2 MG capsule Commonly known as: IMODIUM Take 1 capsule (2 mg total) by mouth as needed  for diarrhea or loose stools.   menthol-cetylpyridinium 3 MG lozenge Commonly known as: CEPACOL Take 1 lozenge (3 mg total) by mouth every 4 (four) hours while awake.      Follow-up Information    Slatosky, Excell Seltzer., MD. Schedule an appointment as soon as possible for a visit in 2 week(s).   Specialty: Family Medicine Contact information: [address] [place] [address] [phone]          Allergies  Allergen Reactions  . Niacin Other (See Comments)    unk    You were cared for by a hospitalist during your hospital stay. If you have any questions about your discharge medications or the care you received while you were in the hospital after you are discharged, you can call the unit and asked to speak with the hospitalist on call if the hospitalist that took care of you is not available. Once you are discharged, your primary care physician will handle any further medical issues. Please note that no refills for any discharge medications will be authorized once you are discharged, as it is imperative that you return to your primary care physician (or establish a relationship with a primary care physician if you do not have one) for your aftercare needs so that they can reassess your need for medications and monitor your lab values.   Procedures/Studies: DG Chest Port 1 View  Result Date: 08/02/2019 CLINICAL DATA:  COVID-19 positive EXAM: PORTABLE CHEST 1 VIEW COMPARISON:  07/31/2019 FINDINGS: Patchy bilateral pneumonia which has progressed. Lung volumes are low. Normal heart size and mediastinal contours. Artifact from EKG leads. IMPRESSION: Progressive bilateral pulmonary infiltrate. Electronically Signed   By: Marnee Spring M.D.   On: 08/02/2019 10:47   DG Chest Port 1 View  Result Date: 07/31/2019 CLINICAL DATA:  Nonproductive cough and shortness of breath. Hypoxia. COVID-19 virus infection. EXAM: PORTABLE CHEST 1 VIEW COMPARISON:  03/22/2011 FINDINGS: The heart size and  mediastinal contours are within normal limits. Low lung volumes are noted. Patchy areas of opacity are seen in the peripheral lung bases bilaterally, suspicious for viral pneumonia. No evidence of pleural effusion. IMPRESSION: Patchy peripheral bibasilar pulmonary opacities, suspicious for viral pneumonia. Electronically Signed   By: Danae Orleans M.D.   On: 07/31/2019 11:28      The results of significant diagnostics from this hospitalization (including imaging, microbiology, ancillary and laboratory) are listed below for reference.     Microbiology: Recent Results (from the past 240 hour(s))  Blood Culture (routine x 2)     Status: None (Preliminary result)   Collection Time: 08/02/19 10:31 AM   Specimen: BLOOD  Result Value Ref Range Status   Specimen Description BLOOD RIGHT ANTECUBITAL  Final   Special Requests   Final    BOTTLES DRAWN AEROBIC AND ANAEROBIC Blood Culture adequate volume Performed at [facility], [address], [place], [place] [address]    Culture NO GROWTH < 24 HOURS  Final   Report Status PENDING  Incomplete  Blood Culture (routine x 2)     Status: None (Preliminary result)  Collection Time: 08/02/19 10:36 AM   Specimen: BLOOD  Result Value Ref Range Status   Specimen Description BLOOD LEFT ANTECUBITAL  Final   Special Requests   Final    BOTTLES DRAWN AEROBIC AND ANAEROBIC Blood Culture results may not be optimal due to an excessive volume of blood received in culture bottles Performed at [facility] [address] [place], [place] [address]    Culture NO GROWTH < 24 HOURS  Final   Report Status PENDING  Incomplete     Labs: BNP (last 3 results) No results for input(s): BNP in the last 8760 hours. Basic Metabolic Panel: Recent Labs  Lab 08/02/19 1007 08/03/19 0326 08/04/19 0449  NA 141 139 141  K 3.4* 3.5 3.5  CL 105 106 108  CO2 20* 23 21*  GLUCOSE 97 119* 111*  BUN 12 17 20   CREATININE 1.49* 1.07 1.12  CALCIUM 8.3* 8.7* 8.5*    Liver Function Tests: Recent Labs  Lab 08/02/19 1007 08/03/19 0326 08/04/19 0449  AST 82* 70* 33  ALT 72* 76* 56*  ALKPHOS 100 94 82  BILITOT 1.8* 1.4* 1.2  PROT 6.4* 6.6 6.5  ALBUMIN 3.1* 3.2* 3.3*   No results for input(s): LIPASE, AMYLASE in the last 168 hours. No results for input(s): AMMONIA in the last 168 hours. CBC: Recent Labs  Lab 08/02/19 1007  WBC 5.2  NEUTROABS 3.7  HGB 13.6  HCT 37.9*  MCV 93.6  PLT 229   Cardiac Enzymes: Recent Labs  Lab 08/02/19 1309  CKTOTAL 90   BNP: Invalid input(s): POCBNP CBG: No results for input(s): GLUCAP in the last 168 hours. D-Dimer Recent Labs    08/03/19 0326 08/04/19 0449  DDIMER 0.92* 0.60*   Hgb A1c No results for input(s): HGBA1C in the last 72 hours. Lipid Profile Recent Labs    08/02/19 1007  TRIG 123   Thyroid function studies No results for input(s): TSH, T4TOTAL, T3FREE, THYROIDAB in the last 72 hours.  Invalid input(s): FREET3 Anemia work up Recent Labs    08/03/19 0326 08/04/19 0450  FERRITIN 485* 612*   Urinalysis    Component Value Date/Time   COLORURINE YELLOW 03/22/2011 2222   APPEARANCEUR CLEAR 03/22/2011 2222   LABSPEC 1.020 03/22/2011 2222   PHURINE 6.0 03/22/2011 2222   GLUCOSEU NEGATIVE 03/22/2011 2222   HGBUR TRACE (A) 03/22/2011 2222   BILIRUBINUR NEGATIVE 03/22/2011 [place] 03/22/2011 2222   PROTEINUR NEGATIVE 03/22/2011 2222   UROBILINOGEN 0.2 03/22/2011 2222   NITRITE NEGATIVE 03/22/2011 2222   LEUKOCYTESUR NEGATIVE 03/22/2011 2222   Sepsis Labs Invalid input(s): PROCALCITONIN,  WBC,  LACTICIDVEN Microbiology Recent Results (from the past 240 hour(s))  Blood Culture (routine x 2)     Status: None (Preliminary result)   Collection Time: 08/02/19 10:31 AM   Specimen: BLOOD  Result Value Ref Range Status   Specimen Description BLOOD RIGHT ANTECUBITAL  Final   Special Requests   Final    BOTTLES DRAWN AEROBIC AND ANAEROBIC Blood Culture  adequate volume Performed at [facility], [place] [address], [place], [place] [address]    Culture NO GROWTH < 24 HOURS  Final   Report Status PENDING  Incomplete  Blood Culture (routine x 2)     Status: None (Preliminary result)   Collection Time: 08/02/19 10:36 AM   Specimen: BLOOD  Result Value Ref Range Status   Specimen Description BLOOD LEFT ANTECUBITAL  Final   Special Requests  Final    BOTTLES DRAWN AEROBIC AND ANAEROBIC Blood Culture results may not be optimal due to an excessive volume of blood received in culture bottles Performed at [facility], [address], [place], [place] [address]    Culture NO GROWTH < 24 HOURS  Final   Report Status PENDING  Incomplete     Time coordinating discharge:  I have spent 35 minutes face to face with the patient and on the ward discussing the patients care, assessment, plan and disposition with other care givers. >50% of the time was devoted counseling the patient about the risks and benefits of treatment/Discharge disposition and coordinating care.   SIGNED:   Dimple Nanas, MD  [facility] 08/04/2019, 10:46 AM   If 7PM-7AM, please contact night-coverage

## 2019-08-04 NOTE — Progress Notes (Signed)
AVS reviewed with the patient and all questions answered, contract signed and placed in the chart. IV removed, site clean dry and intact. Patients belongings returned from security, patient confirmed he has all of his belongings. He was assisted to the exit, family provided transportation.

## 2019-08-04 NOTE — Progress Notes (Signed)
Ambulation Note  Saturation Pre: 96% on RA  Ambulation Distance: 460 ft  Saturation During Ambulation: 93-98% on RA  Notes: Pt walked independently. Pt continually coughed but otherwise tolerated well. Pt returned to bed with call bell within reach, SpO2 96% on RA.   Alisia Ferrari, MS, ACSM CEP 10:13 AM 08/04/2019

## 2019-08-04 NOTE — Discharge Instructions (Addendum)
You are scheduled for an outpatient infusion of Remdesivir at 10:00 AM on Thursday 2/4, and Friday 2/5. Please report to [facility] at [address]  Drive to the security guard and tell them you are here for an infusion. They will direct you to the front entrance where we will come and get you.  For questions call [phone].  Thanks      Person Under Monitoring Name: Johnny Knight  Location: [address] [place] [address]   Infection Prevention Recommendations for Individuals Confirmed to have, or Being Evaluated for, 2019 Novel Coronavirus (COVID-19) Infection Who Receive Care at Home  Individuals who are confirmed to have, or are being evaluated for, COVID-19 should follow the prevention steps below until a healthcare provider or local or state health department says they can return to normal activities.  Stay home except to get medical care You should restrict activities outside your home, except for getting medical care. Do not go to work, school, or public areas, and do not use public transportation or taxis.  Call ahead before visiting your doctor Before your medical appointment, call the healthcare provider and tell them that you have, or are being evaluated for, COVID-19 infection. This will help the healthcare provider's office take steps to keep other people from getting infected. Ask your healthcare provider to call the local or state health department.  Monitor your symptoms Seek prompt medical attention if your illness is worsening (e.g., difficulty breathing). Before going to your medical appointment, call the healthcare provider and tell them that you have, or are being evaluated for, COVID-19 infection. Ask your healthcare provider to call the local or state health department.  Wear a facemask You should wear a facemask that covers your nose and mouth when you are in the same room with other people and when you visit a healthcare  provider. People who live with or visit you should also wear a facemask while they are in the same room with you.  Separate yourself from other people in your home As much as possible, you should stay in a different room from other people in your home. Also, you should use a separate bathroom, if available.  Avoid sharing household items You should not share dishes, drinking glasses, cups, eating utensils, towels, bedding, or other items with other people in your home. After using these items, you should wash them thoroughly with soap and water.  Cover your coughs and sneezes Cover your mouth and nose with a tissue when you cough or sneeze, or you can cough or sneeze into your sleeve. Throw used tissues in a lined trash can, and immediately wash your hands with soap and water for at least 20 seconds or use an alcohol-based hand rub.  Wash your [employer] your hands often and thoroughly with soap and water for at least 20 seconds. You can use an alcohol-based hand sanitizer if soap and water are not available and if your hands are not visibly dirty. Avoid touching your eyes, nose, and mouth with unwashed hands.   Prevention Steps for Caregivers and Household Members of Individuals Confirmed to have, or Being Evaluated for, COVID-19 Infection Being Cared for in the Home  If you live with, or provide care at home for, a person confirmed to have, or being evaluated for, COVID-19 infection please follow these guidelines to prevent infection:  Follow healthcare provider's instructions Make sure that you understand and can help the patient follow any healthcare provider  instructions for all care.  Provide for the patient's basic needs You should help the patient with basic needs in the home and provide support for getting groceries, prescriptions, and other personal needs.  Monitor the patient's symptoms If they are getting sicker, call his or her medical provider and tell them that the  patient has, or is being evaluated for, COVID-19 infection. This will help the healthcare provider's office take steps to keep other people from getting infected. Ask the healthcare provider to call the local or state health department.  Limit the number of people who have contact with the patient  If possible, have only one caregiver for the patient.  Other household members should stay in another home or place of residence. If this is not possible, they should stay  in another room, or be separated from the patient as much as possible. Use a separate bathroom, if available.  Restrict visitors who do not have an essential need to be in the home.  Keep older adults, very young children, and other sick people away from the patient Keep older adults, very young children, and those who have compromised immune systems or chronic health conditions away from the patient. This includes people with chronic heart, lung, or kidney conditions, diabetes, and cancer.  Ensure good ventilation Make sure that shared spaces in the home have good air flow, such as from an air conditioner or an opened window, weather permitting.  Wash your hands often  Wash your hands often and thoroughly with soap and water for at least 20 seconds. You can use an alcohol based hand sanitizer if soap and water are not available and if your hands are not visibly dirty.  Avoid touching your eyes, nose, and mouth with unwashed hands.  Use disposable paper towels to dry your hands. If not available, use dedicated cloth towels and replace them when they become wet.  Wear a facemask and gloves  Wear a disposable facemask at all times in the room and gloves when you touch or have contact with the patient's blood, body fluids, and/or secretions or excretions, such as sweat, saliva, sputum, nasal mucus, vomit, urine, or feces.  Ensure the mask fits over your nose and mouth tightly, and do not touch it during use.  Throw out  disposable facemasks and gloves after using them. Do not reuse.  Wash your hands immediately after removing your facemask and gloves.  If your personal clothing becomes contaminated, carefully remove clothing and launder. Wash your hands after handling contaminated clothing.  Place all used disposable facemasks, gloves, and other waste in a lined container before disposing them with other household waste.  Remove gloves and wash your hands immediately after handling these items.  Do not share dishes, glasses, or other household items with the patient  Avoid sharing household items. You should not share dishes, drinking glasses, cups, eating utensils, towels, bedding, or other items with a patient who is confirmed to have, or being evaluated for, COVID-19 infection.  After the person uses these items, you should wash them thoroughly with soap and water.  Wash laundry thoroughly  Immediately remove and wash clothes or bedding that have blood, body fluids, and/or secretions or excretions, such as sweat, saliva, sputum, nasal mucus, vomit, urine, or feces, on them.  Wear gloves when handling laundry from the patient.  Read and follow directions on labels of laundry or clothing items and detergent. In general, wash and dry with the warmest temperatures recommended on the label.  Clean all areas the individual has used often  Clean all touchable surfaces, such as counters, tabletops, doorknobs, bathroom fixtures, toilets, phones, keyboards, tablets, and bedside tables, every day. Also, clean any surfaces that may have blood, body fluids, and/or secretions or excretions on them.  Wear gloves when cleaning surfaces the patient has come in contact with.  Use a diluted bleach solution (e.g., dilute bleach with 1 part bleach and 10 parts water) or a household disinfectant with a label that says EPA-registered for coronaviruses. To make a bleach solution at home, add 1 tablespoon of bleach to 1  quart (4 cups) of water. For a larger supply, add  cup of bleach to 1 gallon (16 cups) of water.  Read labels of cleaning products and follow recommendations provided on product labels. Labels contain instructions for safe and effective use of the cleaning product including precautions you should take when applying the product, such as wearing gloves or eye protection and making sure you have good ventilation during use of the product.  Remove gloves and wash hands immediately after cleaning.  Monitor yourself for signs and symptoms of illness Caregivers and household members are considered close contacts, should monitor their health, and will be asked to limit movement outside of the home to the extent possible. Follow the monitoring steps for close contacts listed on the symptom monitoring form.   ? If you have additional questions, contact your local health department or call the epidemiologist on call at [phone] (available 24/7). ? This guidance is subject to change. For the most up-to-date guidance from [facility], please refer to their website: YouBlogs.pl

## 2019-08-05 ENCOUNTER — Ambulatory Visit (HOSPITAL_COMMUNITY)
Admission: RE | Admit: 2019-08-05 | Discharge: 2019-08-05 | Disposition: A | Discharge status: Home / Self Care | Payer: BC Managed Care – PPO | Source: Ambulatory Visit | Attending: Pulmonary Disease | Admitting: Pulmonary Disease

## 2019-08-05 ENCOUNTER — Encounter (HOSPITAL_COMMUNITY): Payer: Self-pay

## 2019-08-05 VITALS — BP 115/75 | HR 51 | Temp 97.7°F | Resp 22

## 2019-08-05 DIAGNOSIS — U071 COVID-19: Secondary | ICD-10-CM | POA: Diagnosis not present

## 2019-08-05 MED ORDER — SODIUM CHLORIDE 0.9 % IV SOLN
100.0000 mg | Freq: Once | INTRAVENOUS | Status: AC
  Administered 2019-08-05: 10:00:00 100 mg via INTRAVENOUS
  Filled 2019-08-05: qty 20

## 2019-08-05 MED ORDER — ALBUTEROL SULFATE HFA 108 (90 BASE) MCG/ACT IN AERS: 2.0000 | INHALATION_SPRAY | Freq: Once | RESPIRATORY_TRACT | Status: DC | PRN

## 2019-08-05 MED ORDER — FAMOTIDINE IN NACL 20-0.9 MG/50ML-% IV SOLN: 20.0000 mg | Freq: Once | INTRAVENOUS | Status: DC | PRN

## 2019-08-05 MED ORDER — SODIUM CHLORIDE 0.9 % IV SOLN
INTRAVENOUS | Status: DC | PRN
  Administered 2019-08-05: 10:00:00 250 mL via INTRAVENOUS

## 2019-08-05 MED ORDER — EPINEPHRINE 0.3 MG/0.3ML IJ SOAJ: 0.3000 mg | Freq: Once | INTRAMUSCULAR | Status: DC | PRN

## 2019-08-05 MED ORDER — METHYLPREDNISOLONE SODIUM SUCC 125 MG IJ SOLR: 125.0000 mg | Freq: Once | INTRAMUSCULAR | Status: DC | PRN

## 2019-08-05 MED ORDER — DIPHENHYDRAMINE HCL 50 MG/ML IJ SOLN: 50.0000 mg | Freq: Once | INTRAMUSCULAR | Status: DC | PRN

## 2019-08-05 NOTE — Discharge Instructions (Signed)
10 Things You Can Do to Manage Your COVID-19 Symptoms at Home If you have possible or confirmed COVID-19: 1. Stay home from work and school. And stay away from other public places. If you must go out, avoid using any kind of public transportation, ridesharing, or taxis. 2. Monitor your symptoms carefully. If your symptoms get worse, call your healthcare provider immediately. 3. Get rest and stay hydrated. 4. If you have a medical appointment, call the healthcare provider ahead of time and tell them that you have or may have COVID-19. 5. For medical emergencies, call 911 and notify the dispatch personnel that you have or may have COVID-19. 6. Cover your cough and sneezes with a tissue or use the inside of your elbow. 7. Wash your hands often with soap and water for at least 20 seconds or clean your hands with an alcohol-based hand sanitizer that contains at least 60% alcohol. 8. As much as possible, stay in a specific room and away from other people in your home. Also, you should use a separate bathroom, if available. If you need to be around other people in or outside of the home, wear a mask. 9. Avoid sharing personal items with other people in your household, like dishes, towels, and bedding. 10. Clean all surfaces that are touched often, like counters, tabletops, and doorknobs. Use household cleaning sprays or wipes according to the label instructions. cdc.gov/coronavirus 12/30/2018 This information is not intended to replace advice given to you by your health care provider. Make sure you discuss any questions you have with your health care provider. Document Revised: 06/03/2019 Document Reviewed: 06/03/2019 Elsevier Patient Education  2020 Elsevier Inc.  

## 2019-08-06 ENCOUNTER — Ambulatory Visit (HOSPITAL_COMMUNITY)
Admit: 2019-08-06 | Discharge: 2019-08-06 | Disposition: A | Discharge status: Home / Self Care | Payer: BC Managed Care – PPO | Attending: Pulmonary Disease | Admitting: Pulmonary Disease

## 2019-08-06 ENCOUNTER — Encounter (HOSPITAL_COMMUNITY): Payer: Self-pay

## 2019-08-06 DIAGNOSIS — U071 COVID-19: Secondary | ICD-10-CM

## 2019-08-06 MED ORDER — METHYLPREDNISOLONE SODIUM SUCC 125 MG IJ SOLR: 125.0000 mg | Freq: Once | INTRAMUSCULAR | Status: DC | PRN

## 2019-08-06 MED ORDER — SODIUM CHLORIDE 0.9 % IV SOLN: INTRAVENOUS | Status: DC | PRN

## 2019-08-06 MED ORDER — ALBUTEROL SULFATE HFA 108 (90 BASE) MCG/ACT IN AERS: 2.0000 | INHALATION_SPRAY | Freq: Once | RESPIRATORY_TRACT | Status: DC | PRN

## 2019-08-06 MED ORDER — FAMOTIDINE IN NACL 20-0.9 MG/50ML-% IV SOLN: 20.0000 mg | Freq: Once | INTRAVENOUS | Status: DC | PRN

## 2019-08-06 MED ORDER — SODIUM CHLORIDE 0.9 % IV SOLN
100.0000 mg | Freq: Once | INTRAVENOUS | Status: AC
  Administered 2019-08-06: 10:00:00 100 mg via INTRAVENOUS
  Filled 2019-08-06: qty 20

## 2019-08-06 MED ORDER — DIPHENHYDRAMINE HCL 50 MG/ML IJ SOLN: 50.0000 mg | Freq: Once | INTRAMUSCULAR | Status: DC | PRN

## 2019-08-06 MED ORDER — EPINEPHRINE 0.3 MG/0.3ML IJ SOAJ: 0.3000 mg | Freq: Once | INTRAMUSCULAR | Status: DC | PRN

## 2019-08-06 NOTE — Progress Notes (Signed)
  Diagnosis: COVID-19  Physician: Dr. Wright  Procedure: [facility] Med: remdesivir infusion.  Complications: No immediate complications noted.  Discharge: Discharged home   Johnny Knight 08/06/2019  

## 2019-08-07 LAB — CULTURE, BLOOD (ROUTINE X 2)
Culture: NO GROWTH
Culture: NO GROWTH
Special Requests: ADEQUATE

## 2020-10-14 IMAGING — DX DG CHEST 1V PORT
1 series · 1 of 1 positions shown · non-contrast
Comparison: 03/22/2011

CLINICAL DATA: Nonproductive cough and shortness of breath.
Hypoxia. [id number] virus infection.

EXAM:
PORTABLE CHEST 1 VIEW

[chest ap]
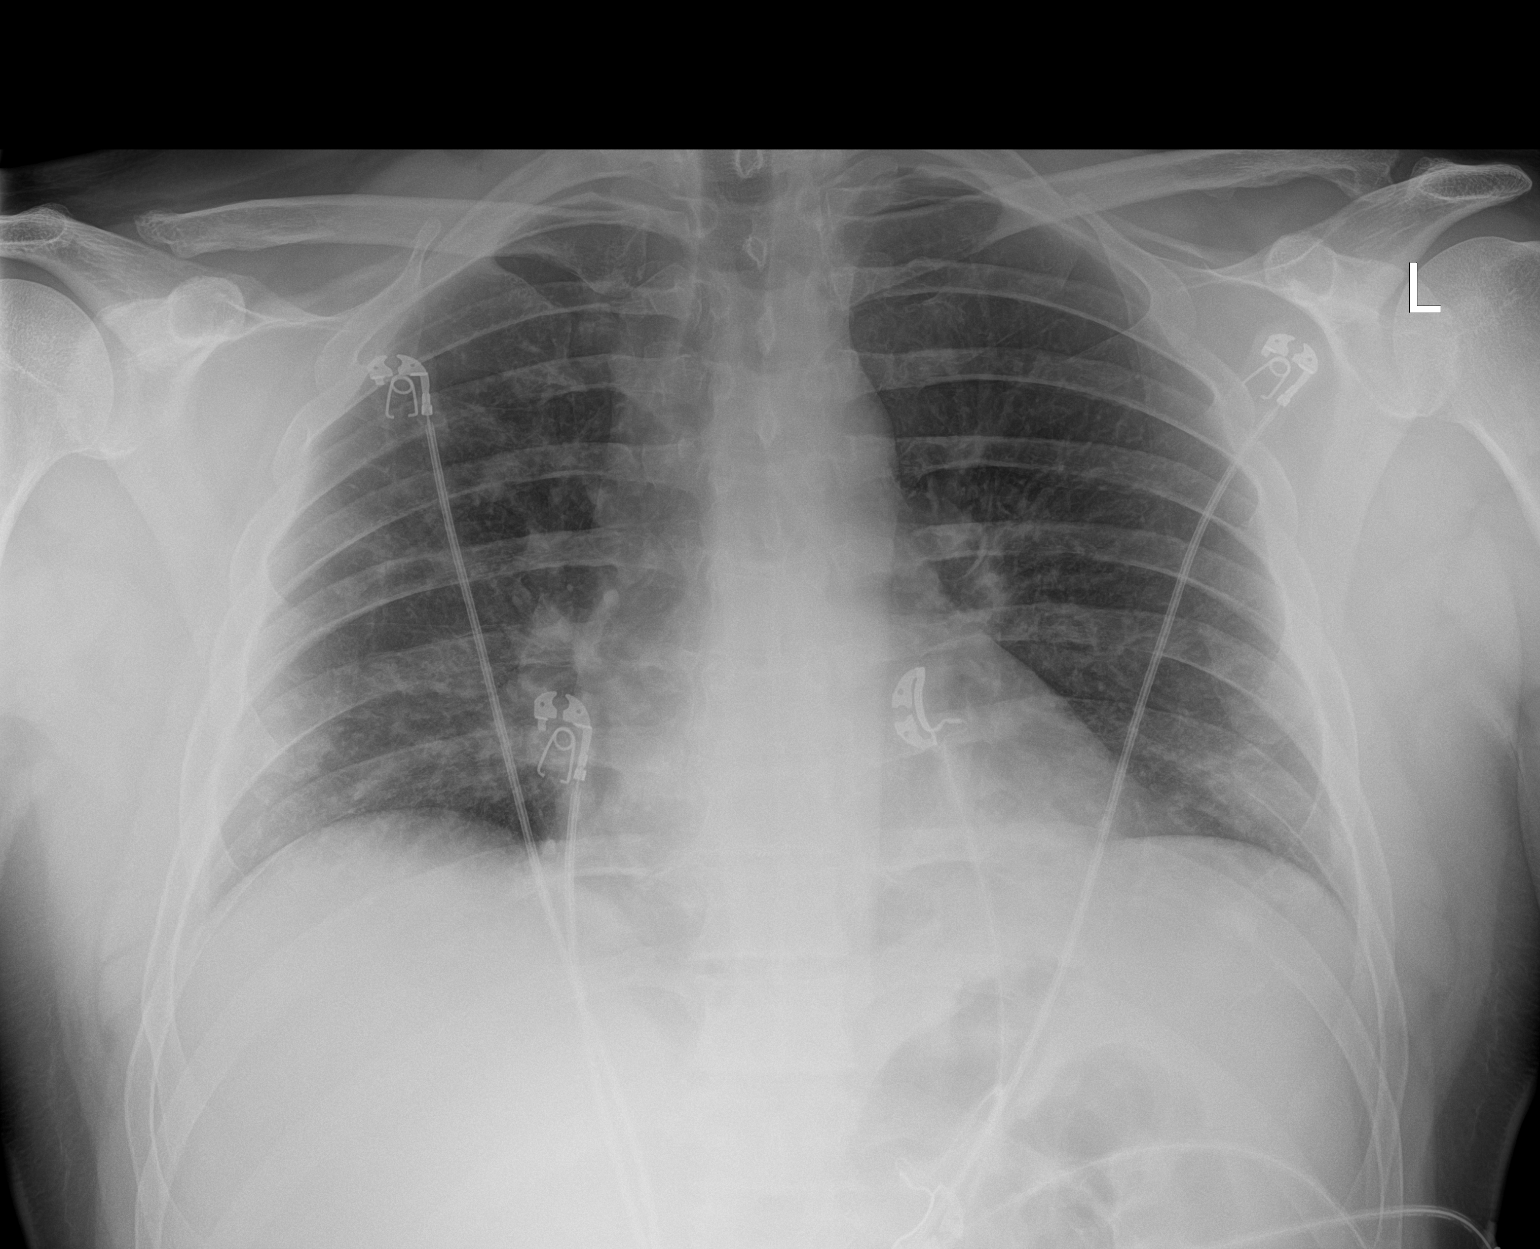

[1 of 1 positions shown; findings below may reference images not displayed]

FINDINGS: The heart size and mediastinal contours are within normal limits.
Low lung volumes are noted. Patchy areas of opacity are seen in the
peripheral lung bases bilaterally, suspicious for viral pneumonia.
No evidence of pleural effusion.
IMPRESSION: Patchy peripheral bibasilar pulmonary opacities, suspicious for
viral pneumonia.

## 2020-10-16 IMAGING — DX DG CHEST 1V PORT
1 series · 1 of 1 positions shown · non-contrast
Comparison: 07/31/2019

CLINICAL DATA: [id number] positive

EXAM:
PORTABLE CHEST 1 VIEW

[chest ap]
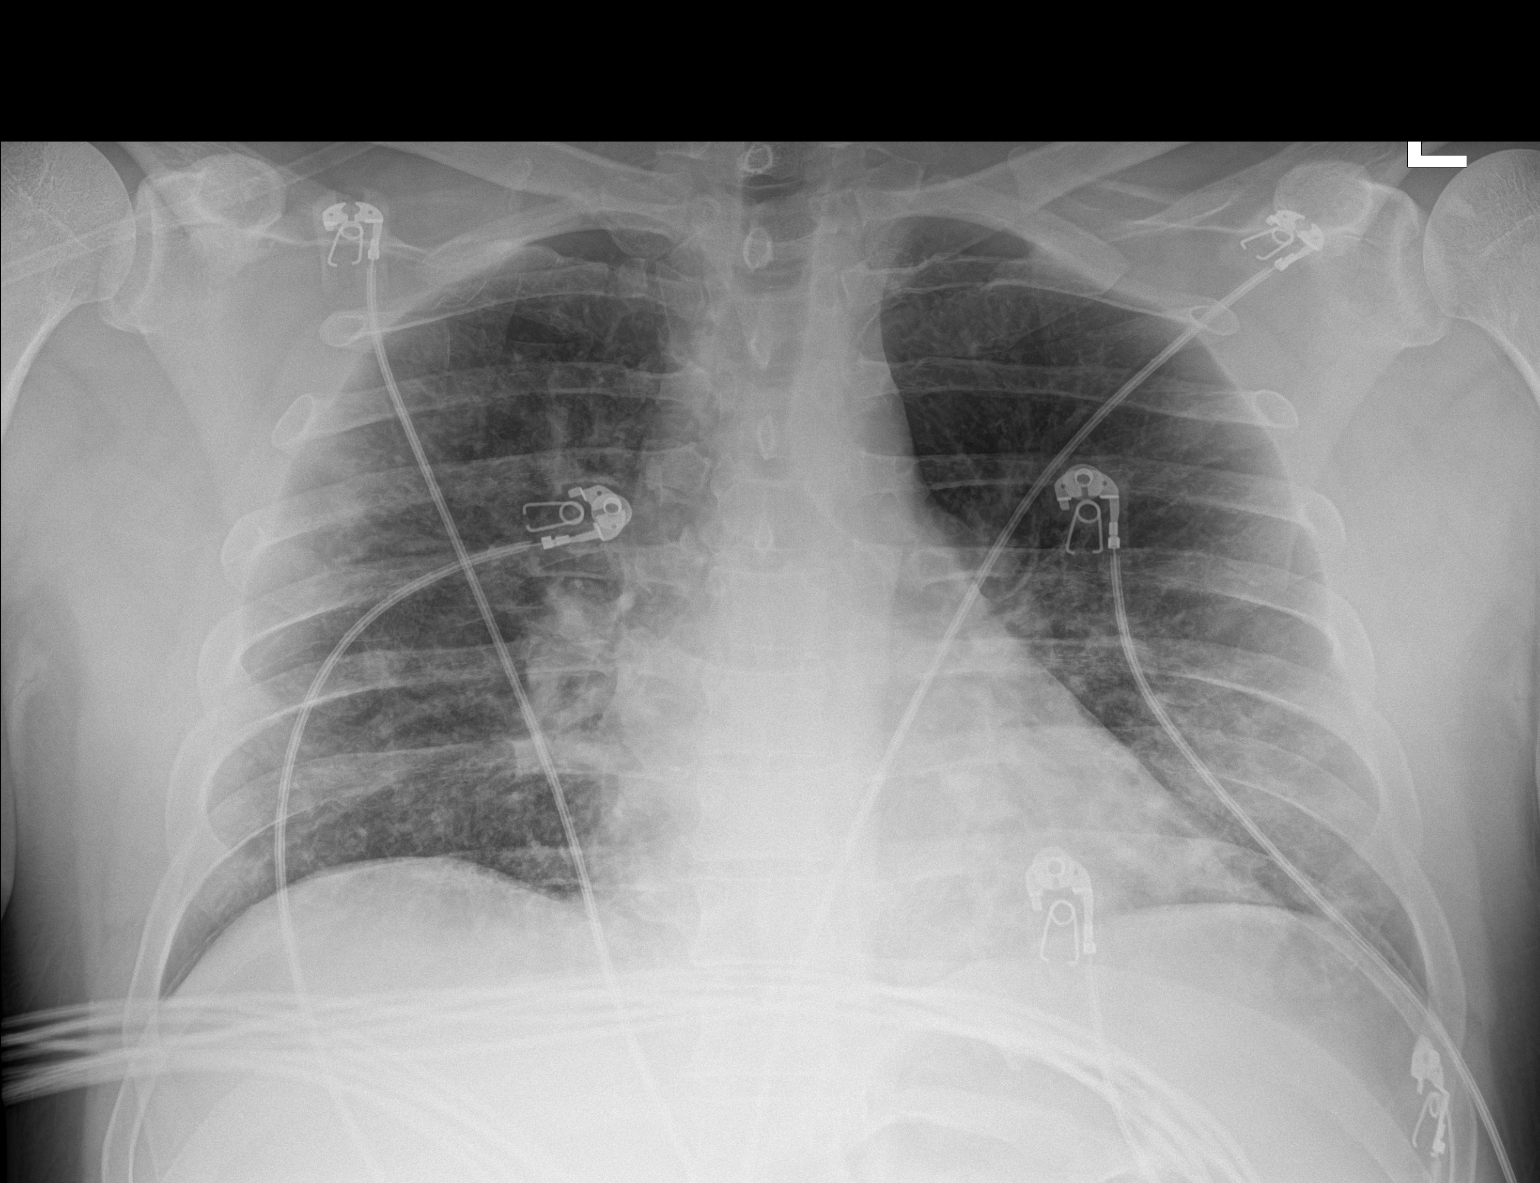

[1 of 1 positions shown; findings below may reference images not displayed]

FINDINGS: Patchy bilateral pneumonia which has progressed. Lung volumes are
low. Normal heart size and mediastinal contours. Artifact from EKG
leads.
IMPRESSION: Progressive bilateral pulmonary infiltrate.
